# Patient Record
Sex: Female | Born: 1945 | Race: White | Hispanic: No | Marital: Married | Smoking: Current every day smoker
Health system: Southern US, Community
[De-identification: ages and names within clinical notes are randomized; demographics above are authoritative.]

## PROBLEM LIST (undated history)

## (undated) DIAGNOSIS — I10 Essential (primary) hypertension: Secondary | ICD-10-CM

## (undated) DIAGNOSIS — E7800 Pure hypercholesterolemia, unspecified: Secondary | ICD-10-CM

## (undated) DIAGNOSIS — E039 Hypothyroidism, unspecified: Secondary | ICD-10-CM

## (undated) DIAGNOSIS — E079 Disorder of thyroid, unspecified: Secondary | ICD-10-CM

## (undated) DIAGNOSIS — F419 Anxiety disorder, unspecified: Secondary | ICD-10-CM

## (undated) DIAGNOSIS — K219 Gastro-esophageal reflux disease without esophagitis: Secondary | ICD-10-CM

## (undated) HISTORY — PX: OTHER SURGICAL HISTORY: SHX169

---

## 2004-09-15 ENCOUNTER — Ambulatory Visit: Payer: Self-pay | Admitting: *Deleted

## 2005-05-15 ENCOUNTER — Ambulatory Visit: Payer: Self-pay | Admitting: Family Medicine

## 2005-08-30 ENCOUNTER — Ambulatory Visit: Payer: Self-pay | Admitting: Family Medicine

## 2006-01-04 ENCOUNTER — Ambulatory Visit: Payer: Self-pay | Admitting: Family Medicine

## 2006-03-29 ENCOUNTER — Ambulatory Visit: Payer: Self-pay | Admitting: Family Medicine

## 2006-03-30 ENCOUNTER — Ambulatory Visit: Payer: Self-pay | Admitting: Family Medicine

## 2006-08-08 ENCOUNTER — Ambulatory Visit: Payer: Self-pay | Admitting: Family Medicine

## 2006-08-15 ENCOUNTER — Ambulatory Visit: Payer: Self-pay | Admitting: Family Medicine

## 2006-10-17 ENCOUNTER — Ambulatory Visit: Payer: Self-pay | Admitting: Family Medicine

## 2009-05-22 ENCOUNTER — Emergency Department (HOSPITAL_COMMUNITY): Admission: EM | Admit: 2009-05-22 | Discharge: 2009-05-22 | Payer: Self-pay | Admitting: Emergency Medicine

## 2009-11-07 ENCOUNTER — Emergency Department (HOSPITAL_COMMUNITY): Admission: EM | Admit: 2009-11-07 | Discharge: 2009-11-07 | Payer: Self-pay | Admitting: Emergency Medicine

## 2009-11-11 ENCOUNTER — Ambulatory Visit (HOSPITAL_COMMUNITY): Admission: RE | Admit: 2009-11-11 | Discharge: 2009-11-11 | Payer: Self-pay | Admitting: Family Medicine

## 2011-07-31 ENCOUNTER — Encounter: Payer: Self-pay | Admitting: Emergency Medicine

## 2011-07-31 ENCOUNTER — Emergency Department (HOSPITAL_COMMUNITY)
Admission: EM | Admit: 2011-07-31 | Discharge: 2011-07-31 | Disposition: A | Discharge status: Home / Self Care | Payer: Medicare Other | Attending: Emergency Medicine | Admitting: Emergency Medicine

## 2011-07-31 ENCOUNTER — Emergency Department (HOSPITAL_COMMUNITY): Payer: Medicare Other

## 2011-07-31 DIAGNOSIS — F17200 Nicotine dependence, unspecified, uncomplicated: Secondary | ICD-10-CM | POA: Insufficient documentation

## 2011-07-31 DIAGNOSIS — I10 Essential (primary) hypertension: Secondary | ICD-10-CM | POA: Insufficient documentation

## 2011-07-31 DIAGNOSIS — R141 Gas pain: Secondary | ICD-10-CM | POA: Insufficient documentation

## 2011-07-31 DIAGNOSIS — R109 Unspecified abdominal pain: Secondary | ICD-10-CM | POA: Insufficient documentation

## 2011-07-31 DIAGNOSIS — K5900 Constipation, unspecified: Secondary | ICD-10-CM | POA: Insufficient documentation

## 2011-07-31 DIAGNOSIS — R1900 Intra-abdominal and pelvic swelling, mass and lump, unspecified site: Secondary | ICD-10-CM | POA: Insufficient documentation

## 2011-07-31 DIAGNOSIS — R142 Eructation: Secondary | ICD-10-CM | POA: Insufficient documentation

## 2011-07-31 DIAGNOSIS — R10817 Generalized abdominal tenderness: Secondary | ICD-10-CM | POA: Insufficient documentation

## 2011-07-31 DIAGNOSIS — R143 Flatulence: Secondary | ICD-10-CM | POA: Insufficient documentation

## 2011-07-31 HISTORY — DX: Essential (primary) hypertension: I10

## 2011-07-31 LAB — URINALYSIS, ROUTINE W REFLEX MICROSCOPIC
Bilirubin Urine: NEGATIVE
Glucose, UA: NEGATIVE mg/dL
Hgb urine dipstick: NEGATIVE
Ketones, ur: NEGATIVE mg/dL
Specific Gravity, Urine: 1.02 (ref 1.005–1.030)
Urobilinogen, UA: 0.2 mg/dL (ref 0.0–1.0)

## 2011-07-31 LAB — URINE MICROSCOPIC-ADD ON

## 2011-07-31 NOTE — ED Notes (Signed)
Pt reports a hx of constipation.  Pt reports not having a "good" bm in the past 2 weeks.  Pt reports having the "feeling" to go, but unable to.  Pt reports swelling and pain to her abdomen since yesterday.  Pt reports drinking metamucil, prune juice, and eating a laxative yesterday w/out relief.

## 2011-07-31 NOTE — ED Notes (Signed)
Assessment unchanged report received

## 2011-07-31 NOTE — ED Provider Notes (Signed)
History   This chart was scribed for Erin Lennert, MD by Clarita Crane. The patient was seen in room APA09/APA09 and the patient's care was started at 4:30PM.   CSN: [id number] Arrival date & time: 07/31/2011  4:01 PM   First MD Initiated Contact with Patient 07/31/11 1624      Chief Complaint  Patient presents with  . Abdominal Pain   HPI Erin Watkins is a 65 y.o. female who presents to the Emergency Department complaining of waxing and waning moderate diffuse abdominal pain onset several days ago and persistent since with associated mild abdominal swelling and constipation. Patient states constipation has not been relieved with use of Miralax, Metamucil and prune juice. Reports she has experienced similar symptoms previously for which she was treated at [facility] ED for. Denies nausea, vomiting, diarrhea, fever, SOB, chest pain. Patient with h/o hypertension and is a current everyday smoker.   Past Medical History  Diagnosis Date  . Hypertension     Past Surgical History  Procedure Date  . Breast biospy     History reviewed. No pertinent family history.  History  Substance Use Topics  . Smoking status: Current Everyday Smoker    Types: Cigarettes  . Smokeless tobacco: Not on file  . Alcohol Use: No    OB History    Grav Para Term Preterm Abortions TAB SAB Ect Mult Living                  Review of Systems  Constitutional: Negative for fatigue.  HENT: Negative for congestion, sinus pressure and ear discharge.   Eyes: Negative for discharge.  Respiratory: Negative for cough.   Cardiovascular: Negative for chest pain.  Gastrointestinal: Positive for abdominal pain, constipation and abdominal distention. Negative for nausea, vomiting and diarrhea.  Genitourinary: Negative for frequency and hematuria.  Musculoskeletal: Negative for back pain.  Skin: Negative for rash.  Neurological: Negative for seizures and headaches.  Hematological: Negative.     Psychiatric/Behavioral: Negative for hallucinations.    Allergies  Review of patient's allergies indicates no known allergies.  Home Medications   Current Outpatient Rx  Name Route Sig Dispense Refill  . ASPIRIN 325 MG PO TABS Oral Take 325 mg by mouth as needed. For pain     . BENAZEPRIL HCL 20 MG PO TABS Oral Take 20 mg by mouth every morning.      Marland Kitchen HYDROCHLOROTHIAZIDE 25 MG PO TABS Oral Take 25 mg by mouth every morning.      Marland Kitchen LEVOTHYROXINE SODIUM 137 MCG PO TABS Oral Take 137 mcg by mouth at bedtime.      . SERTRALINE HCL 100 MG PO TABS Oral Take 100 mg by mouth daily.        BP 132/70  Pulse 76  Temp(Src) 98.2 F (36.8 C) (Oral)  Resp 18  Ht 5\' 5"  (1.651 m)  Wt 145 lb (65.772 kg)  BMI 24.13 kg/m2  SpO2 98%  Physical Exam  Nursing note and vitals reviewed. Constitutional: She is oriented to person, place, and time. She appears well-developed and well-nourished. No distress.  HENT:  Head: Normocephalic and atraumatic.  Eyes: EOM are normal. Pupils are equal, round, and reactive to light.  Neck: Neck supple. No tracheal deviation present.  Cardiovascular: Normal rate and regular rhythm.  Exam reveals no gallop and no friction rub.   No murmur heard. Pulmonary/Chest: Effort normal. No respiratory distress. She has no wheezes.  Abdominal: Soft. She exhibits no distension. There is generalized  tenderness (mild).  Musculoskeletal: Normal range of motion. She exhibits no edema.       DP pulses intact bilaterally.   Neurological: She is alert and oriented to person, place, and time. No sensory deficit.  Skin: Skin is warm and dry.  Psychiatric: She has a normal mood and affect. Her behavior is normal.    ED Course  Procedures (including critical care time)  DIAGNOSTIC STUDIES: Oxygen Saturation is 98% on room air, normal by my interpretation.    COORDINATION OF CARE: 7:33PM- Patient informed of current lab and imaging results which show collection of stool. Patient  informed of intent to d/c home with prescription for a laxative. Patient agrees with current plan.   Labs Reviewed  URINALYSIS, ROUTINE W REFLEX MICROSCOPIC - Abnormal; Notable for the following:    Leukocytes, UA TRACE (*)    All other components within normal limits  URINE MICROSCOPIC-ADD ON - Abnormal; Notable for the following:    Squamous Epithelial / LPF MANY (*)    All other components within normal limits   Dg Abd Acute W/chest  07/31/2011  *RADIOLOGY REPORT*  Clinical Data: Abdominal pain and distention.  ACUTE ABDOMEN SERIES (ABDOMEN 2 VIEW & CHEST 1 VIEW)  Comparison: Two views of the chest 11/07/2009.  Findings: Single view of the chest demonstrates clear lungs.  Lungs appear emphysematous.  Heart size is normal.  No pneumothorax or effusion.  Two views of the abdomen show a normal bowel gas pattern.  No free intraperitoneal air is identified.  No unexpected abdominal calcification.  IMPRESSION: No acute finding chest or abdomen.  Original Report Authenticated By: Bernadene Bell. D'ALESSIO, M.D.     1. Constipation       MDM      The chart was scribed for me under my direct supervision.  I personally performed the history, physical, and medical decision making and all procedures in the evaluation of this patient.Erin Lennert, MD 07/31/11 [phone]

## 2011-07-31 NOTE — ED Notes (Signed)
Pt c/o abd pain and constipation. Pt states when she bends over she feels pressure on her rectum.

## 2012-11-18 ENCOUNTER — Encounter (HOSPITAL_COMMUNITY): Payer: Self-pay

## 2012-11-18 ENCOUNTER — Emergency Department (HOSPITAL_COMMUNITY)
Admission: EM | Admit: 2012-11-18 | Discharge: 2012-11-18 | Disposition: A | Discharge status: Home / Self Care | Payer: Medicare Other | Attending: Emergency Medicine | Admitting: Emergency Medicine

## 2012-11-18 DIAGNOSIS — Z7982 Long term (current) use of aspirin: Secondary | ICD-10-CM | POA: Insufficient documentation

## 2012-11-18 DIAGNOSIS — E079 Disorder of thyroid, unspecified: Secondary | ICD-10-CM | POA: Insufficient documentation

## 2012-11-18 DIAGNOSIS — R109 Unspecified abdominal pain: Secondary | ICD-10-CM | POA: Insufficient documentation

## 2012-11-18 DIAGNOSIS — R300 Dysuria: Secondary | ICD-10-CM | POA: Insufficient documentation

## 2012-11-18 DIAGNOSIS — I10 Essential (primary) hypertension: Secondary | ICD-10-CM | POA: Insufficient documentation

## 2012-11-18 DIAGNOSIS — R350 Frequency of micturition: Secondary | ICD-10-CM | POA: Insufficient documentation

## 2012-11-18 DIAGNOSIS — F17200 Nicotine dependence, unspecified, uncomplicated: Secondary | ICD-10-CM | POA: Insufficient documentation

## 2012-11-18 DIAGNOSIS — Z79899 Other long term (current) drug therapy: Secondary | ICD-10-CM | POA: Insufficient documentation

## 2012-11-18 HISTORY — DX: Disorder of thyroid, unspecified: E07.9

## 2012-11-18 LAB — URINE MICROSCOPIC-ADD ON

## 2012-11-18 LAB — URINALYSIS, ROUTINE W REFLEX MICROSCOPIC
Glucose, UA: NEGATIVE mg/dL
Leukocytes, UA: NEGATIVE
Nitrite: NEGATIVE
Specific Gravity, Urine: 1.025 (ref 1.005–1.030)
Urobilinogen, UA: 0.2 mg/dL (ref 0.0–1.0)
pH: 6 (ref 5.0–8.0)

## 2012-11-18 MED ORDER — CEPHALEXIN 500 MG PO CAPS: 500.0000 mg | ORAL_CAPSULE | Freq: Four times a day (QID) | ORAL | Status: DC

## 2012-11-18 NOTE — ED Provider Notes (Signed)
History     CSN: [id number]  Arrival date & time 11/18/12  1251   First MD Initiated Contact with Patient 11/18/12 1305      Chief Complaint  Patient presents with  . Dysuria     Patient is a 67 y.o. female presenting with dysuria. The history is provided by the patient.  Dysuria  This is a new problem. The current episode started more than 1 week ago. The problem occurs every urination. The problem has been gradually worsening. The pain is moderate. There has been no fever. Associated symptoms include frequency. Pertinent negatives include no chills, no nausea, no vomiting, no discharge, no hematuria, no possible pregnancy and no flank pain. She has tried nothing for the symptoms.  pt reports she has suprapubic pain and it will be relieved with urination.  No vag bleeding or discharge.  She denies vaginal irritation.  No fever.  She does report recent migraine but that has improved.  She reports compliance with her HTN meds.    Past Medical History  Diagnosis Date  . Hypertension   . Thyroid disease     Past Surgical History  Procedure Laterality Date  . Breast biospy      History reviewed. No pertinent family history.  History  Substance Use Topics  . Smoking status: Current Every Day Smoker    Types: Cigarettes  . Smokeless tobacco: Not on file  . Alcohol Use: No    OB History   Grav Para Term Preterm Abortions TAB SAB Ect Mult Living                  Review of Systems  Constitutional: Negative for fever, chills and fatigue.  Respiratory: Negative for shortness of breath.   Cardiovascular: Negative for chest pain.  Gastrointestinal: Positive for abdominal pain. Negative for nausea, vomiting and diarrhea.  Genitourinary: Positive for dysuria and frequency. Negative for hematuria, flank pain, vaginal bleeding, vaginal discharge, vaginal pain and pelvic pain.  Musculoskeletal: Negative for back pain.  Neurological: Negative for weakness.  Psychiatric/Behavioral:  Negative for agitation.  All other systems reviewed and are negative.    Allergies  Review of patient's allergies indicates no known allergies.  Home Medications   Current Outpatient Rx  Name  Route  Sig  Dispense  Refill  . aspirin 325 MG tablet   Oral   Take 325 mg by mouth daily. For pain         . benazepril (LOTENSIN) 20 MG tablet   Oral   Take 20 mg by mouth every morning.           Marland Kitchen BIOTIN PO   Oral   Take 3 tablets by mouth daily.         Marland Kitchen levothyroxine (SYNTHROID) 150 MCG tablet   Oral   Take 150 mcg by mouth daily. Patient is taking the name brand medication           BP 211/77  Pulse 78  Temp(Src) 98.2 F (36.8 C) (Oral)  Resp 18  Ht 5' 5.5" (1.664 m)  Wt 155 lb (70.308 kg)  BMI 25.39 kg/m2  SpO2 99%  Physical Exam CONSTITUTIONAL: Well developed/well nourished HEAD: Normocephalic/atraumatic EYES: EOMI/PERRL ENMT: Mucous membranes moist NECK: supple no meningeal signs SPINE:entire spine nontender CV: S1/S2 noted, no murmurs/rubs/gallops noted LUNGS: Lungs are clear to auscultation bilaterally, no apparent distress ABDOMEN: soft, minimal suprapubic tenderness, no rebound or guarding GU:no cva tenderness NEURO: Pt is awake/alert, moves all extremitiesx4, no ataxia  EXTREMITIES: pulses normal, full ROM SKIN: warm, color normal PSYCH: no abnormalities of mood noted  ED Course  Procedures   Labs Reviewed  URINALYSIS, ROUTINE W REFLEX MICROSCOPIC - Abnormal; Notable for the following:    Hgb urine dipstick SMALL (*)    All other components within normal limits  URINE MICROSCOPIC-ADD ON - Abnormal; Notable for the following:    Squamous Epithelial / LPF FEW (*)    All other components within normal limits  URINE CULTURE   Pt well appearing and in no distress.  Her abdominal exam is benign.  She is ambulatory and without any weakness or pain.  She is hypertensive but has no signs of hypertensive emergency - no cp/sob.  No focal weakness.   No significant HA currently.;  She reports most of her pain is just before she urinates and then is relieved with urination.  Even though u/a is not impressive for significant uti, will place on keflex, send urine culture and refer to urology.  She has no signs of acute abdominal process at this time   MDM  Nursing notes including past medical history and social history reviewed and considered in documentation Labs/vital reviewed and considered         Joya Gaskins, MD 11/18/12 1531

## 2012-11-18 NOTE — ED Notes (Signed)
Pt c/o lower abdominal pain along with increase urinary urgency and polyuria. Pt denies burning with urination but states discomfort is relieved after she urinates.

## 2012-11-18 NOTE — ED Notes (Addendum)
Pt reports she thinks she has a possible bladder infection. Denies blood in urine. Reports pain when urinating and also prior to urination. Reports soreness.  Upon taking her blood pressure today, 220/94 in right arm and 211/77 in left arm. Pt reports HA but reports history of migraines. Pt reports taking BP meds and took 20mg  of Benazepril and 2 aspirins today. Pt reports she has had problems with elevated BP within the past few weeks.

## 2012-11-19 LAB — URINE CULTURE: Colony Count: 6000

## 2015-01-31 ENCOUNTER — Encounter (HOSPITAL_COMMUNITY): Payer: Self-pay | Admitting: Emergency Medicine

## 2015-01-31 ENCOUNTER — Emergency Department (HOSPITAL_COMMUNITY)
Admission: EM | Admit: 2015-01-31 | Discharge: 2015-01-31 | Disposition: A | Discharge status: Home / Self Care | Payer: Medicare Other | Attending: Emergency Medicine | Admitting: Emergency Medicine

## 2015-01-31 ENCOUNTER — Emergency Department (HOSPITAL_COMMUNITY): Payer: Medicare Other

## 2015-01-31 DIAGNOSIS — Z720 Tobacco use: Secondary | ICD-10-CM | POA: Insufficient documentation

## 2015-01-31 DIAGNOSIS — I10 Essential (primary) hypertension: Secondary | ICD-10-CM | POA: Insufficient documentation

## 2015-01-31 DIAGNOSIS — Z7982 Long term (current) use of aspirin: Secondary | ICD-10-CM | POA: Diagnosis not present

## 2015-01-31 DIAGNOSIS — E079 Disorder of thyroid, unspecified: Secondary | ICD-10-CM | POA: Diagnosis not present

## 2015-01-31 DIAGNOSIS — N390 Urinary tract infection, site not specified: Secondary | ICD-10-CM | POA: Diagnosis not present

## 2015-01-31 DIAGNOSIS — R1012 Left upper quadrant pain: Secondary | ICD-10-CM | POA: Diagnosis present

## 2015-01-31 DIAGNOSIS — R109 Unspecified abdominal pain: Secondary | ICD-10-CM

## 2015-01-31 DIAGNOSIS — Z79899 Other long term (current) drug therapy: Secondary | ICD-10-CM | POA: Diagnosis not present

## 2015-01-31 LAB — URINALYSIS, ROUTINE W REFLEX MICROSCOPIC
BILIRUBIN URINE: NEGATIVE
GLUCOSE, UA: NEGATIVE mg/dL
KETONES UR: NEGATIVE mg/dL
NITRITE: NEGATIVE
Protein, ur: NEGATIVE mg/dL
Specific Gravity, Urine: >1.03 — ABNORMAL HIGH (ref 1.005–1.030)
UROBILINOGEN UA: 0.2 mg/dL (ref 0.0–1.0)
pH: 5.5 (ref 5.0–8.0)

## 2015-01-31 LAB — COMPREHENSIVE METABOLIC PANEL
ALK PHOS: 70 U/L (ref 38–126)
ALT: 16 U/L (ref 14–54)
AST: 22 U/L (ref 15–41)
Albumin: 3.8 g/dL (ref 3.5–5.0)
Anion gap: 7 (ref 5–15)
BILIRUBIN TOTAL: 0.3 mg/dL (ref 0.3–1.2)
BUN: 17 mg/dL (ref 6–20)
CO2: 28 mmol/L (ref 22–32)
CREATININE: 0.73 mg/dL (ref 0.44–1.00)
Calcium: 9.3 mg/dL (ref 8.9–10.3)
Chloride: 105 mmol/L (ref 101–111)
GFR calc non Af Amer: >60 mL/min (ref >60)
GLUCOSE: 102 mg/dL — AB (ref 65–99)
POTASSIUM: 3.8 mmol/L (ref 3.5–5.1)
Sodium: 140 mmol/L (ref 135–145)
TOTAL PROTEIN: 6.8 g/dL (ref 6.5–8.1)

## 2015-01-31 LAB — CBC WITH DIFFERENTIAL/PLATELET
BASOS ABS: 0 10*3/uL (ref 0.0–0.1)
Basophils Relative: 0 % (ref 0–1)
Eosinophils Absolute: 0.4 10*3/uL (ref 0.0–0.7)
Eosinophils Relative: 5 % (ref 0–5)
HEMATOCRIT: 41.1 % (ref 36.0–46.0)
Hemoglobin: 13.8 g/dL (ref 12.0–15.0)
LYMPHS PCT: 37 % (ref 12–46)
Lymphs Abs: 2.9 10*3/uL (ref 0.7–4.0)
MCH: 28.4 pg (ref 26.0–34.0)
MCHC: 33.6 g/dL (ref 30.0–36.0)
MCV: 84.6 fL (ref 78.0–100.0)
MONO ABS: 0.4 10*3/uL (ref 0.1–1.0)
MONOS PCT: 5 % (ref 3–12)
NEUTROS ABS: 4.2 10*3/uL (ref 1.7–7.7)
Neutrophils Relative %: 53 % (ref 43–77)
PLATELETS: 241 10*3/uL (ref 150–400)
RBC: 4.86 MIL/uL (ref 3.87–5.11)
RDW: 13.8 % (ref 11.5–15.5)
WBC: 7.9 10*3/uL (ref 4.0–10.5)

## 2015-01-31 LAB — URINE MICROSCOPIC-ADD ON

## 2015-01-31 MED ORDER — HYDROMORPHONE HCL 1 MG/ML IJ SOLN
1.0000 mg | Freq: Once | INTRAMUSCULAR | Status: AC
  Administered 2015-01-31: 1 mg via INTRAMUSCULAR
  Filled 2015-01-31: qty 1

## 2015-01-31 MED ORDER — IOHEXOL 300 MG/ML  SOLN
100.0000 mL | Freq: Once | INTRAMUSCULAR | Status: AC | PRN
  Administered 2015-01-31: 100 mL via INTRAVENOUS

## 2015-01-31 MED ORDER — OXYCODONE-ACETAMINOPHEN 5-325 MG PO TABS: 1.0000 | ORAL_TABLET | ORAL | Status: DC | PRN

## 2015-01-31 MED ORDER — SODIUM CHLORIDE 0.9 % IV BOLUS (SEPSIS)
1000.0000 mL | Freq: Once | INTRAVENOUS | Status: AC
  Administered 2015-01-31: 1000 mL via INTRAVENOUS

## 2015-01-31 MED ORDER — IOHEXOL 300 MG/ML  SOLN
50.0000 mL | Freq: Once | INTRAMUSCULAR | Status: AC | PRN
Start: 2015-01-31 — End: 2015-01-31
  Administered 2015-01-31: 50 mL via ORAL

## 2015-01-31 MED ORDER — DEXTROSE 5 % IV SOLN
1.0000 g | Freq: Once | INTRAVENOUS | Status: AC
  Administered 2015-01-31: 1 g via INTRAVENOUS
  Filled 2015-01-31: qty 10

## 2015-01-31 MED ORDER — DEXTROSE 5 % IV SOLN
1.0000 g | Freq: Once | INTRAVENOUS | Status: AC
  Administered 2015-01-31: 1 g via INTRAVENOUS

## 2015-01-31 MED ORDER — CIPROFLOXACIN HCL 500 MG PO TABS: 500.0000 mg | ORAL_TABLET | Freq: Two times a day (BID) | ORAL | Status: DC

## 2015-01-31 MED ORDER — PROMETHAZINE HCL 25 MG PO TABS: 25.0000 mg | ORAL_TABLET | Freq: Four times a day (QID) | ORAL | Status: DC | PRN

## 2015-01-31 MED ORDER — MORPHINE SULFATE 4 MG/ML IJ SOLN
4.0000 mg | Freq: Once | INTRAMUSCULAR | Status: AC
  Administered 2015-01-31: 4 mg via INTRAVENOUS
  Filled 2015-01-31: qty 1

## 2015-01-31 MED ORDER — ONDANSETRON HCL 4 MG/2ML IJ SOLN
4.0000 mg | Freq: Once | INTRAMUSCULAR | Status: AC
  Administered 2015-01-31: 4 mg via INTRAVENOUS
  Filled 2015-01-31: qty 2

## 2015-01-31 NOTE — Discharge Instructions (Signed)
We'll treat you for a continued urinary tract infection. Prescription for antibiotic, pain medicine, nausea medicine. Follow-up your primary care doctor. Increase fluids.  CBC and chemistry panel were normal. Urinalysis shows 7-10 white cells and a few bacteria. CT scan of abdomen and pelvis revealed no obvious pathology for symptoms.  She had diverticulosis but not diverticulitis. Also noted were some calcifications in the aorta

## 2015-01-31 NOTE — ED Provider Notes (Signed)
CSN: [id number]     Arrival date & time 01/31/15  1739 History   First MD Initiated Contact with Patient 01/31/15 1813     Chief Complaint  Patient presents with  . Flank Pain     (Consider location/radiation/quality/duration/timing/severity/associated sxs/prior Treatment) HPI .Marland Kitchen.... Left flank pain radiating to the left upper quadrant for one month. Patient is eating normally. She has tried aspirin and hydrocodone with minimal relief. No dysuria, fever, chills, vomiting, diarrhea, hematuria. No trauma. No history of distal bowel pathology. Severity is moderate. Past Medical History  Diagnosis Date  . Hypertension   . Thyroid disease    Past Surgical History  Procedure Laterality Date  . Breast biospy     History reviewed. No pertinent family history. History  Substance Use Topics  . Smoking status: Current Every Day Smoker -- 0.50 packs/day    Types: Cigarettes  . Smokeless tobacco: Not on file  . Alcohol Use: No   OB History    No data available     Review of Systems  All other systems reviewed and are negative.     Allergies  Review of patient's allergies indicates no known allergies.  Home Medications   Prior to Admission medications   Medication Sig Start Date End Date Taking? Authorizing Provider  aspirin 325 MG tablet Take 650 mg by mouth every 6 (six) hours as needed for mild pain or moderate pain. For pain   Yes Historical Provider, MD  benazepril (LOTENSIN) 20 MG tablet Take 20 mg by mouth every morning.     Yes Historical Provider, MD  levothyroxine (SYNTHROID, LEVOTHROID) 100 MCG tablet Take 100 mcg by mouth daily. 01/26/15 01/26/16 Yes Historical Provider, MD  cephALEXin (KEFLEX) 500 MG capsule Take 1 capsule (500 mg total) by mouth 4 (four) times daily. Patient not taking: Reported on 01/31/2015 11/18/12   Zadie Rhineonald Wickline, MD  ciprofloxacin (CIPRO) 500 MG tablet Take 1 tablet (500 mg total) by mouth 2 (two) times daily. One po bid x 7 days 01/31/15   Donnetta HutchingBrian  Nasira Janusz, MD  oxyCODONE-acetaminophen (PERCOCET) 5-325 MG per tablet Take 1-2 tablets by mouth every 4 (four) hours as needed. 01/31/15   Donnetta HutchingBrian Deyonte Cadden, MD  promethazine (PHENERGAN) 25 MG tablet Take 1 tablet (25 mg total) by mouth every 6 (six) hours as needed. 01/31/15   Donnetta HutchingBrian Indie Boehne, MD   BP 195/80 mmHg  Pulse 65  Temp(Src) 98.1 F (36.7 C) (Oral)  Resp 18  Ht 5\' 5"  (1.651 m)  Wt 155 lb (70.308 kg)  BMI 25.79 kg/m2  SpO2 97% Physical Exam  Constitutional: She is oriented to person, place, and time. She appears well-developed and well-nourished.  HENT:  Head: Normocephalic and atraumatic.  Eyes: Conjunctivae and EOM are normal. Pupils are equal, round, and reactive to light.  Neck: Normal range of motion. Neck supple.  Cardiovascular: Normal rate and regular rhythm.   Pulmonary/Chest: Effort normal and breath sounds normal.  Abdominal: Soft. Bowel sounds are normal.  Minimal left upper quadrant tenderness  Genitourinary:  Minimal left flank tenderness  Musculoskeletal: Normal range of motion.  Neurological: She is alert and oriented to person, place, and time.  Skin: Skin is warm and dry.  Psychiatric: She has a normal mood and affect. Her behavior is normal.  Nursing note and vitals reviewed.   ED Course  Procedures (including critical care time) Labs Review Labs Reviewed  COMPREHENSIVE METABOLIC PANEL - Abnormal; Notable for the following:    Glucose, Bld 102 (*)  All other components within normal limits  URINALYSIS, ROUTINE W REFLEX MICROSCOPIC - Abnormal; Notable for the following:    Specific Gravity, Urine >1.030 (*)    Hgb urine dipstick TRACE (*)    Leukocytes, UA TRACE (*)    All other components within normal limits  URINE MICROSCOPIC-ADD ON - Abnormal; Notable for the following:    Squamous Epithelial / LPF FEW (*)    Bacteria, UA FEW (*)    All other components within normal limits  URINE CULTURE  CBC WITH DIFFERENTIAL/PLATELET    Imaging Review Ct Abdomen  Pelvis W Contrast  01/31/2015   CLINICAL DATA:  Subacute onset of left flank pain for 3 weeks. Initial encounter.  EXAM: CT ABDOMEN AND PELVIS WITH CONTRAST  TECHNIQUE: Multidetector CT imaging of the abdomen and pelvis was performed using the standard protocol following bolus administration of intravenous contrast.  CONTRAST:  100mL OMNIPAQUE IOHEXOL 300 MG/ML  SOLN  COMPARISON:  Abdominal radiograph performed 07/31/2011  FINDINGS: Minimal bibasilar atelectasis is noted.  The liver and spleen are unremarkable in appearance. The gallbladder is within normal limits. The pancreas and adrenal glands are unremarkable.  Bilateral renal pelvicaliectasis remains within normal limits. There is no definite evidence of hydronephrosis. There is no evidence of hydronephrosis. No renal or ureteral stones are seen. No perinephric stranding is appreciated.  No free fluid is identified. The small bowel is unremarkable in appearance. The stomach is within normal limits. No acute vascular abnormalities are seen. Relatively diffuse calcification is seen along the abdominal aorta and its branches.  The appendix is normal in caliber and contains air, without evidence of appendicitis. Scattered diverticulosis is noted along the sigmoid colon, without evidence of diverticulitis.  The bladder is moderately distended and grossly unremarkable. The uterus is unremarkable in appearance. The ovaries are relatively symmetric. No suspicious adnexal masses are seen. No inguinal lymphadenopathy is seen.  No acute osseous abnormalities are identified.  IMPRESSION: 1. No acute abnormality seen within the abdomen or pelvis. 2. Relatively diffuse calcification along the abdominal aorta and its branches. 3. Scattered diverticulosis along the sigmoid colon, without evidence of diverticulitis.   Electronically Signed   By: Roanna RaiderJeffery  Chang M.D.   On: 01/31/2015 20:36     EKG Interpretation None      MDM   Final diagnoses:  Left flank pain  UTI  (lower urinary tract infection)    No acute abdomen. Vital signs are normal. CT scan shows diverticulosis but no diverticulitis. There is also some calcifications along the abdominal aorta. This would not explain her symptoms. She has evidence of a minor urinary infection. Discharge medications include Cipro 500 mg, Percocet, Phenergan 25 mg. She has primary care follow-up    Donnetta HutchingBrian Milferd Ansell, MD 01/31/15 2200

## 2015-01-31 NOTE — ED Notes (Signed)
PT c/o left flank pain x3 weeks. PT denies any injury recently and denies any urinary symptoms. PT denies any n/v/d.

## 2015-02-02 LAB — URINE CULTURE
CULTURE: NO GROWTH
Colony Count: NO GROWTH
SPECIAL REQUESTS: NORMAL

## 2015-11-22 DIAGNOSIS — E785 Hyperlipidemia, unspecified: Secondary | ICD-10-CM | POA: Insufficient documentation

## 2016-01-24 DIAGNOSIS — E039 Hypothyroidism, unspecified: Secondary | ICD-10-CM | POA: Insufficient documentation

## 2016-01-24 DIAGNOSIS — I10 Essential (primary) hypertension: Secondary | ICD-10-CM | POA: Insufficient documentation

## 2017-01-18 DIAGNOSIS — J301 Allergic rhinitis due to pollen: Secondary | ICD-10-CM | POA: Insufficient documentation

## 2017-06-27 ENCOUNTER — Encounter (INDEPENDENT_AMBULATORY_CARE_PROVIDER_SITE_OTHER): Payer: Self-pay | Admitting: Internal Medicine

## 2017-06-27 ENCOUNTER — Encounter (INDEPENDENT_AMBULATORY_CARE_PROVIDER_SITE_OTHER): Payer: Self-pay

## 2017-07-24 ENCOUNTER — Encounter (INDEPENDENT_AMBULATORY_CARE_PROVIDER_SITE_OTHER): Payer: Self-pay | Admitting: *Deleted

## 2017-07-24 ENCOUNTER — Ambulatory Visit (INDEPENDENT_AMBULATORY_CARE_PROVIDER_SITE_OTHER): Payer: Medicare Other | Admitting: Internal Medicine

## 2017-07-24 ENCOUNTER — Telehealth (INDEPENDENT_AMBULATORY_CARE_PROVIDER_SITE_OTHER): Payer: Self-pay | Admitting: *Deleted

## 2017-07-24 ENCOUNTER — Encounter (INDEPENDENT_AMBULATORY_CARE_PROVIDER_SITE_OTHER): Payer: Self-pay | Admitting: Internal Medicine

## 2017-07-24 VITALS — BP 160/100 | HR 68 | Temp 98.3°F | Ht 66.0 in | Wt 149.6 lb

## 2017-07-24 DIAGNOSIS — K581 Irritable bowel syndrome with constipation: Secondary | ICD-10-CM

## 2017-07-24 DIAGNOSIS — Z1211 Encounter for screening for malignant neoplasm of colon: Secondary | ICD-10-CM | POA: Insufficient documentation

## 2017-07-24 DIAGNOSIS — K580 Irritable bowel syndrome with diarrhea: Secondary | ICD-10-CM

## 2017-07-24 NOTE — Patient Instructions (Addendum)
The risks of bleeding, perforation and infection were reviewed with patient. Fiber 4 gm po.

## 2017-07-24 NOTE — Progress Notes (Signed)
   Subjective:    Patient ID: Erin Watkins, female    DOB: 05/06/1946, 71 y.o.   MRN: [id number]  HPI Referred by Dr. Lysbeth GalasNyland for chronic diarrhea. She has had diarrhea x 5 months. Saw Dr. Joyce CopaNyland's PA about 5 months ago and was told to take anti-diarrhea which did help. Today she states she is constipated.  She says her stools are small. The constipation started about 7 days ago.  There has been no blood. She is having about 2-3 stools a day. Stools are not hard. She is not having to strain. She eats one meal a day.  She has lost about 15 pounds which was intentional.   She further states she has had diarrhea for about a 1 1/2 yrs.  Usually has a BM immediately after eating.  [place].    Her last colonoscopy 10-12 yrs ago. She thinks Dr. Katrinka BlazingSmith did her last colonoscopy.    Review of Systems Past Medical History:  Diagnosis Date  . Hypertension   . Thyroid disease     Past Surgical History:  Procedure Laterality Date  . breast biospy      No Known Allergies  Current Outpatient Medications on File Prior to Visit  Medication Sig Dispense Refill  . aspirin 325 MG tablet Take 650 mg by mouth every 6 (six) hours as needed for mild pain or moderate pain. For pain    . atorvastatin (LIPITOR) 20 MG tablet Take 20 mg daily by mouth.    . benazepril (LOTENSIN) 20 MG tablet Take 20 mg by mouth every morning.      . diphenhydrAMINE (BENADRYL) 25 mg capsule Take 25 mg at bedtime as needed by mouth.    . levothyroxine (SYNTHROID, LEVOTHROID) 100 MCG tablet Take 100 mcg daily before breakfast by mouth.    . Melatonin-Pyridoxine (MELATIN PO) Take by mouth.     No current facility-administered medications on file prior to visit.         Objective:   Physical Exam Blood pressure (!) 200/100, pulse 68, temperature 98.3 F (36.8 C), height 5\' 6"  (1.676 m), weight 149 lb 9.6 oz (67.9 kg).  Alert and oriented.  Skin warm and dry. Oral mucosa is moist.   . Sclera anicteric, conjunctivae is pink. Thyroid not enlarged. No cervical lymphadenopathy. Lungs clear. Heart regular rate and rhythm.  Abdomen is soft. Bowel sounds are positive. No hepatomegaly. No abdominal masses felt. No tenderness.  No edema to lower extremities.          Assessment & Plan:  Diarrhea alternating with constipation.  Needs to take Fiber 4 gm po daily.  Screening colonoscopy. The risks of bleeding, perforation and infection were reviewed with patient.

## 2017-07-24 NOTE — Telephone Encounter (Signed)
Patient needs trilyte 

## 2017-07-25 MED ORDER — PEG 3350-KCL-NA BICARB-NACL 420 G PO SOLR: 4000.0000 mL | Freq: Once | ORAL | 0 refills | Status: AC

## 2017-08-30 ENCOUNTER — Encounter (INDEPENDENT_AMBULATORY_CARE_PROVIDER_SITE_OTHER): Payer: Self-pay | Admitting: *Deleted

## 2017-10-12 ENCOUNTER — Ambulatory Visit (HOSPITAL_COMMUNITY)
Admission: RE | Admit: 2017-10-12 | Discharge: 2017-10-12 | Disposition: A | Discharge status: Home / Self Care | Payer: Medicare Other | Source: Ambulatory Visit | Attending: Internal Medicine | Admitting: Internal Medicine

## 2017-10-12 ENCOUNTER — Other Ambulatory Visit: Payer: Self-pay

## 2017-10-12 ENCOUNTER — Encounter (HOSPITAL_COMMUNITY): Payer: Self-pay | Admitting: *Deleted

## 2017-10-12 ENCOUNTER — Encounter (HOSPITAL_COMMUNITY)
Admission: RE | Disposition: A | Discharge status: Home / Self Care | Payer: Self-pay | Source: Ambulatory Visit | Attending: Internal Medicine

## 2017-10-12 DIAGNOSIS — Z7982 Long term (current) use of aspirin: Secondary | ICD-10-CM | POA: Insufficient documentation

## 2017-10-12 DIAGNOSIS — Z79899 Other long term (current) drug therapy: Secondary | ICD-10-CM | POA: Insufficient documentation

## 2017-10-12 DIAGNOSIS — K581 Irritable bowel syndrome with constipation: Secondary | ICD-10-CM | POA: Insufficient documentation

## 2017-10-12 DIAGNOSIS — Z1211 Encounter for screening for malignant neoplasm of colon: Secondary | ICD-10-CM | POA: Diagnosis not present

## 2017-10-12 DIAGNOSIS — K219 Gastro-esophageal reflux disease without esophagitis: Secondary | ICD-10-CM | POA: Diagnosis not present

## 2017-10-12 DIAGNOSIS — I10 Essential (primary) hypertension: Secondary | ICD-10-CM | POA: Diagnosis not present

## 2017-10-12 DIAGNOSIS — F17210 Nicotine dependence, cigarettes, uncomplicated: Secondary | ICD-10-CM | POA: Insufficient documentation

## 2017-10-12 DIAGNOSIS — K580 Irritable bowel syndrome with diarrhea: Secondary | ICD-10-CM | POA: Insufficient documentation

## 2017-10-12 DIAGNOSIS — E039 Hypothyroidism, unspecified: Secondary | ICD-10-CM | POA: Insufficient documentation

## 2017-10-12 DIAGNOSIS — F419 Anxiety disorder, unspecified: Secondary | ICD-10-CM | POA: Diagnosis not present

## 2017-10-12 DIAGNOSIS — E7800 Pure hypercholesterolemia, unspecified: Secondary | ICD-10-CM | POA: Diagnosis not present

## 2017-10-12 DIAGNOSIS — K5730 Diverticulosis of large intestine without perforation or abscess without bleeding: Secondary | ICD-10-CM

## 2017-10-12 HISTORY — DX: Gastro-esophageal reflux disease without esophagitis: K21.9

## 2017-10-12 HISTORY — PX: COLONOSCOPY: SHX5424

## 2017-10-12 HISTORY — DX: Anxiety disorder, unspecified: F41.9

## 2017-10-12 HISTORY — DX: Hypothyroidism, unspecified: E03.9

## 2017-10-12 HISTORY — DX: Pure hypercholesterolemia, unspecified: E78.00

## 2017-10-12 SURGERY — COLONOSCOPY
Anesthesia: Moderate Sedation

## 2017-10-12 MED ORDER — MIDAZOLAM HCL 5 MG/5ML IJ SOLN
INTRAMUSCULAR | Status: DC | PRN
  Administered 2017-10-12: 1 mg via INTRAVENOUS
  Administered 2017-10-12 (×4): 2 mg via INTRAVENOUS

## 2017-10-12 MED ORDER — STERILE WATER FOR IRRIGATION IR SOLN
Status: DC | PRN
  Administered 2017-10-12: 100 mL

## 2017-10-12 MED ORDER — MIDAZOLAM HCL 5 MG/5ML IJ SOLN
INTRAMUSCULAR | Status: AC
  Filled 2017-10-12: qty 10

## 2017-10-12 MED ORDER — MEPERIDINE HCL 50 MG/ML IJ SOLN
INTRAMUSCULAR | Status: DC | PRN
  Administered 2017-10-12 (×2): 25 mg via INTRAVENOUS

## 2017-10-12 MED ORDER — MEPERIDINE HCL 50 MG/ML IJ SOLN
INTRAMUSCULAR | Status: AC
  Filled 2017-10-12: qty 1

## 2017-10-12 MED ORDER — SODIUM CHLORIDE 0.9 % IV SOLN
INTRAVENOUS | Status: DC
  Administered 2017-10-12: 08:00:00 via INTRAVENOUS

## 2017-10-12 MED ORDER — DICYCLOMINE HCL 10 MG PO CAPS: 10.0000 mg | ORAL_CAPSULE | Freq: Two times a day (BID) | ORAL | 5 refills | Status: DC

## 2017-10-12 NOTE — H&P (Addendum)
Erin Watkins is an 72 y.o. female.   Chief Complaint: Patient is here for colonoscopy. HPI: Patient is 72 year old Caucasian female who is here for colonoscopy.  Last exam was normal about 12 years ago.  History of intermittent diarrhea.  In between she has been constipated.  She is felt to have IBS.  She denies rectal bleeding abdominal pain nocturnal diarrhea or weight loss. Family history is negative for CRC or IBD.  Past Medical History:  Diagnosis Date  . Anxiety   . GERD (gastroesophageal reflux disease)   . Hypercholesteremia   . Hypertension   . Hypothyroidism   . Thyroid disease     Past Surgical History:  Procedure Laterality Date  . breast biospy      Family History  Problem Relation Age of Onset  . Colon cancer Neg Hx    Social History:  reports that she has been smoking cigarettes.  She has a 15.00 pack-year smoking history. she has never used smokeless tobacco. She reports that she does not drink alcohol or use drugs.  Allergies: No Known Allergies  Medications Prior to Admission  Medication Sig Dispense Refill  . aspirin 325 MG tablet Take 325-650 mg by mouth 2 (two) times daily as needed for moderate pain (takes scheduled at least once daily). For pain    . atorvastatin (LIPITOR) 20 MG tablet Take 20 mg by mouth at bedtime.     . benazepril (LOTENSIN) 40 MG tablet Take 20 mg by mouth daily.  3  . diphenhydrAMINE (BENADRYL) 25 mg capsule Take 25 mg by mouth at bedtime as needed (for sleep.).     Marland Kitchen. levothyroxine (SYNTHROID, LEVOTHROID) 100 MCG tablet Take 100 mcg daily before breakfast by mouth.    . Melatonin 3 MG TABS Take 3 mg by mouth at bedtime as needed (for sleep.).    Marland Kitchen. Aspirin-Acetaminophen-Caffeine (GOODYS EXTRA STRENGTH) 500-325-65 MG PACK Take 1 packet by mouth every 8 (eight) hours as needed (for pain.).      No results found for this or any previous visit (from the past 48 hour(s)). No results found.  ROS  Blood pressure 133/85, pulse 79,  temperature 97.6 F (36.4 C), temperature source Oral, resp. rate 12, height 5\' 6"  (1.676 m), weight 149 lb (67.6 kg), SpO2 97 %. Physical Exam  Constitutional: She appears well-developed and well-nourished.  HENT:  Mouth/Throat: Oropharynx is clear and moist.  Eyes: Conjunctivae are normal. No scleral icterus.  Neck: No thyromegaly present.  Cardiovascular: Normal rate, regular rhythm and normal heart sounds.  No murmur heard. Respiratory: Effort normal and breath sounds normal.  GI: Soft. She exhibits no distension and no mass. There is no tenderness.  Musculoskeletal: She exhibits no edema.  Lymphadenopathy:    She has no cervical adenopathy.  Neurological: She is alert.  Skin: Skin is warm and dry.     Assessment/Plan Average risk screening colonoscopy.  Lionel DecemberNajeeb Rehman, MD 10/12/2017, 9:02 AM

## 2017-10-12 NOTE — Discharge Instructions (Signed)
Resume medications including aspirin at usual dose. Dicyclomine 10 mg by mouth before breakfast and evening meal.  Take it every day for 1-2 weeks and thereafter on as-needed basis. No driving for 24 hours. Virtual colonoscopy to be scheduled in 1 month.  Office will call.        Colonoscopy, Adult, Care After This sheet gives you information about how to care for yourself after your procedure. Your doctor may also give you more specific instructions. If you have problems or questions, call your doctor. Follow these instructions at home: General instructions   For the first 24 hours after the procedure: ? Do not drive or use machinery. ? Do not sign important documents. ? Do not drink alcohol. ? Do your daily activities more slowly than normal. ? Eat foods that are soft and easy to digest. ? Rest often.  Take over-the-counter or prescription medicines only as told by your doctor.  It is up to you to get the results of your procedure. Ask your doctor, or the department performing the procedure, when your results will be ready. To help cramping and bloating:  Try walking around.  Put heat on your belly (abdomen) as told by your doctor. Use a heat source that your doctor recommends, such as a moist heat pack or a heating pad. ? Put a towel between your skin and the heat source. ? Leave the heat on for 20-30 minutes. ? Remove the heat if your skin turns bright red. This is especially important if you cannot feel pain, heat, or cold. You can get burned. Eating and drinking  Drink enough fluid to keep your pee (urine) clear or pale yellow.  Return to your normal diet as told by your doctor. Avoid heavy or fried foods that are hard to digest.  Avoid drinking alcohol for as long as told by your doctor. Contact a doctor if:  You have blood in your poop (stool) 2-3 days after the procedure. Get help right away if:  You have more than a small amount of blood in your poop.  You  see large clumps of tissue (blood clots) in your poop.  Your belly is swollen.  You feel sick to your stomach (nauseous).  You throw up (vomit).  You have a fever.  You have belly pain that gets worse, and medicine does not help your pain. This information is not intended to replace advice given to you by your health care provider. Make sure you discuss any questions you have with your health care provider. Document Released: 10/07/2010 Document Revised: 05/29/2016 Document Reviewed: 05/29/2016 Elsevier Interactive Patient Education  2017 [employer]     Diverticulosis Diverticulosis is a condition that develops when small pouches (diverticula) form in the wall of the large intestine (colon). The colon is where water is absorbed and stool is formed. The pouches form when the inside layer of the colon pushes through weak spots in the outer layers of the colon. You may have a few pouches or many of them. What are the causes? The cause of this condition is not known. What increases the risk? The following factors may make you more likely to develop this condition:  Being older than age 77. Your risk for this condition increases with age. Diverticulosis is rare among people younger than age 47. By age 51, many people have it.  Eating a low-fiber diet.  Having frequent constipation.  Being overweight.  Not getting enough exercise.  Smoking.  Taking over-the-counter pain medicines,  like aspirin and ibuprofen.  Having a family history of diverticulosis.  What are the signs or symptoms? In most people, there are no symptoms of this condition. If you do have symptoms, they may include:  Bloating.  Cramps in the abdomen.  Constipation or diarrhea.  Pain in the lower left side of the abdomen.  How is this diagnosed? This condition is most often diagnosed during an exam for other colon problems. Because diverticulosis usually has no symptoms, it often cannot be diagnosed  independently. This condition may be diagnosed by:  Using a flexible scope to examine the colon (colonoscopy).  Taking an X-ray of the colon after dye has been put into the colon (barium enema).  Doing a CT scan.  How is this treated? You may not need treatment for this condition if you have never developed an infection related to diverticulosis. If you have had an infection before, treatment may include:  Eating a high-fiber diet. This may include eating more fruits, vegetables, and grains.  Taking a fiber supplement.  Taking a live bacteria supplement (probiotic).  Taking medicine to relax your colon.  Taking antibiotic medicines.  Follow these instructions at home:  Drink 6-8 glasses of water or more each day to prevent constipation.  Try not to strain when you have a bowel movement.  If you have had an infection before: ? Eat more fiber as directed by your health care provider or your diet and nutrition specialist (dietitian). ? Take a fiber supplement or probiotic, if your health care provider approves.  Take over-the-counter and prescription medicines only as told by your health care provider.  If you were prescribed an antibiotic, take it as told by your health care provider. Do not stop taking the antibiotic even if you start to feel better.  Keep all follow-up visits as told by your health care provider. This is important. Contact a health care provider if:  You have pain in your abdomen.  You have bloating.  You have cramps.  You have not had a bowel movement in 3 days. Get help right away if:  Your pain gets worse.  Your bloating becomes very bad.  You have a fever or chills, and your symptoms suddenly get worse.  You vomit.  You have bowel movements that are bloody or black.  You have bleeding from your rectum. Summary  Diverticulosis is a condition that develops when small pouches (diverticula) form in the wall of the large intestine  (colon).  You may have a few pouches or many of them.  This condition is most often diagnosed during an exam for other colon problems.  If you have had an infection related to diverticulosis, treatment may include increasing the fiber in your diet, taking supplements, or taking medicines. This information is not intended to replace advice given to you by your health care provider. Make sure you discuss any questions you have with your health care provider. Document Released: 06/01/2004 Document Revised: 07/24/2016 Document Reviewed: 07/24/2016 Elsevier Interactive Patient Education  2017 [employer]    Irritable Bowel Syndrome, Adult Irritable bowel syndrome (IBS) is not one specific disease. It is a group of symptoms that affects the organs responsible for digestion (gastrointestinal or GI tract). To regulate how your GI tract works, your body sends signals back and forth between your intestines and your brain. If you have IBS, there may be a problem with these signals. As a result, your GI tract does not function normally. Your intestines may become  more sensitive and overreact to certain things. This is especially true when you eat certain foods or when you are under stress. There are four types of IBS. These may be determined based on the consistency of your stool:  IBS with diarrhea.  IBS with constipation.  Mixed IBS.  Unsubtyped IBS.  It is important to know which type of IBS you have. Some treatments are more likely to be helpful for certain types of IBS. What are the causes? The exact cause of IBS is not known. What increases the risk? You may have a higher risk of IBS if:  You are a woman.  You are younger than 72 years old.  You have a family history of IBS.  You have mental health problems.  You have had bacterial infection of your GI tract.  What are the signs or symptoms? Symptoms of IBS vary from person to person. The main symptom is abdominal pain or  discomfort. Additional symptoms usually include one or more of the following:  Diarrhea, constipation, or both.  Abdominal swelling or bloating.  Feeling full or sick after eating a small or regular-size meal.  Frequent gas.  Mucus in the stool.  A feeling of having more stool left after a bowel movement.  Symptoms tend to come and go. They may be associated with stress, psychiatric conditions, or nothing at all. How is this diagnosed? There is no specific test to diagnose IBS. Your health care provider will make a diagnosis based on a physical exam, medical history, and your symptoms. You may have other tests to rule out other conditions that may be causing your symptoms. These may include:  Blood tests.  X-rays.  CT scan.  Endoscopy and colonoscopy. This is a test in which your GI tract is viewed with a long, thin, flexible tube.  How is this treated? There is no cure for IBS, but treatment can help relieve symptoms. IBS treatment often includes:  Changes to your diet, such as: ? Eating more fiber. ? Avoiding foods that cause symptoms. ? Drinking more water. ? Eating regular, medium-sized portioned meals.  Medicines. These may include: ? Fiber supplements if you have constipation. ? Medicine to control diarrhea (antidiarrheal medicines). ? Medicine to help control muscle spasms in your GI tract (antispasmodic medicines). ? Medicines to help with any mental health issues, such as antidepressants or tranquilizers.  Therapy. ? Talk therapy may help with anxiety, depression, or other mental health issues that can make IBS symptoms worse.  Stress reduction. ? Managing your stress can help keep symptoms under control.  Follow these instructions at home:  Take medicines only as directed by your health care provider.  Eat a healthy diet. ? Avoid foods and drinks with added sugar. ? Include more whole grains, fruits, and vegetables gradually into your diet. This may be  especially helpful if you have IBS with constipation. ? Avoid any foods and drinks that make your symptoms worse. These may include dairy products and caffeinated or carbonated drinks. ? Do not eat large meals. ? Drink enough fluid to keep your urine clear or pale yellow.  Exercise regularly. Ask your health care provider for recommendations of good activities for you.  Keep all follow-up visits as directed by your health care provider. This is important. Contact a health care provider if:  You have constant pain.  You have trouble or pain with swallowing.  You have worsening diarrhea. Get help right away if:  You have severe and worsening abdominal  pain.  You have diarrhea and: ? You have a rash, stiff neck, or severe headache. ? You are irritable, sleepy, or difficult to awaken. ? You are weak, dizzy, or extremely thirsty.  You have bright red blood in your stool or you have black tarry stools.  You have unusual abdominal swelling that is painful.  You vomit continuously.  You vomit blood (hematemesis).  You have both abdominal pain and a fever. This information is not intended to replace advice given to you by your health care provider. Make sure you discuss any questions you have with your health care provider. Document Released: 09/04/2005 Document Revised: 02/04/2016 Document Reviewed: 05/22/2014 Elsevier Interactive Patient Education  2018 [employer].

## 2017-10-12 NOTE — Op Note (Signed)
[facility] Patient Name: Erin AlesConnie Watkins Procedure Date: 10/12/2017 8:44 AM MRN: [id number] Date of Birth: 05/11/1946 Attending MD: Lionel DecemberNajeeb Raushanah Osmundson , MD CSN: [id number] Age: [age] Admit Type: Outpatient Procedure:                Colonoscopy Indications:              Screening for colorectal malignant neoplasm Providers:                Lionel DecemberNajeeb Oakley Orban, MD, Edrick Kinsammy Vaught, RN, Edythe ClarityKelly Cox,                            Technician Referring MD:             Joette CatchingLeonard Nyland, MD Medicines:                Meperidine 50 mg IV, Midazolam 9 mg IV Complications:            No immediate complications. Estimated Blood Loss:     Estimated blood loss: none. Procedure:                Pre-Anesthesia Assessment:                           - Prior to the procedure, a History and Physical                            was performed, and patient medications and                            allergies were reviewed. The patient's tolerance of                            previous anesthesia was also reviewed. The risks                            and benefits of the procedure and the sedation                            options and risks were discussed with the patient.                            All questions were answered, and informed consent                            was obtained. Prior Anticoagulants: The patient                            last took aspirin 2 days prior to the procedure.                            ASA Grade Assessment: I - A normal, healthy                            patient. After reviewing the risks and benefits,  the patient was deemed in satisfactory condition to                            undergo the procedure.                           After obtaining informed consent, the colonoscope                            was passed under direct vision. Throughout the                            procedure, the patient's blood pressure, pulse, and                            oxygen  saturations were monitored continuously. The                            [id number]) scope was introduced through                            the anus and advanced to the the ascending colon.                            The colonoscopy was technically difficult and                            complex due to restricted mobility of the colon.                            The patient tolerated the procedure fairly well.                            The quality of the bowel preparation was good. The                            ileocecal valve and the rectum were photographed. Scope In: 9:11:12 AM Scope Out: 9:40:36 AM Total Procedure Duration: 0 hours 29 minutes 24 seconds  Findings:      The perianal and digital rectal examinations were normal.      A few medium-mouthed diverticula were found in the sigmoid colon.      The exam was otherwise normal throughout the examined colon.      The retroflexed view of the distal rectum and anal verge was normal and       showed no anal or rectal abnormalities. Impression:               - Incomplete exam to ascending colon.                           - Diverticulosis in the sigmoid colon.                           - The distal rectum and anal verge are normal on  retroflexion view.                           - No specimens collected. Moderate Sedation:      Moderate (conscious) sedation was administered by the endoscopy nurse       and supervised by the endoscopist. The following parameters were       monitored: oxygen saturation, heart rate, blood pressure, CO2       capnography and response to care. Total physician intraservice time was       34 minutes. Recommendation:           - Patient has a contact number available for                            emergencies. The signs and symptoms of potential                            delayed complications were discussed with the                            patient. Return to normal  activities tomorrow.                            Written discharge instructions were provided to the                            patient.                           - High fiber diet today.                           - Continue present medications.                           - Virtual colonoscopy to be scheduled.                           - Dicyclomine 10 mg po bid.                           - No recommendation at this time regarding repeat                            colonoscopy.                           - Return to GI clinic in 3 months. Procedure Code(s):        --- Professional ---                           [phone], 53, Colonoscopy, flexible; diagnostic,                            including collection of specimen(s) by brushing or  washing, when performed (separate procedure)                           99152, Moderate sedation services provided by the                            same physician or other qualified health care                            professional performing the diagnostic or                            therapeutic service that the sedation supports,                            requiring the presence of an independent trained                            observer to assist in the monitoring of the                            patient's level of consciousness and physiological                            status; initial 15 minutes of intraservice time,                            patient age 58 years or older                           [phone], Moderate sedation services; each additional                            15 minutes intraservice time Diagnosis Code(s):        --- Professional ---                           Z12.11, Encounter for screening for malignant                            neoplasm of colon                           K57.30, Diverticulosis of large intestine without                            perforation or abscess without bleeding CPT copyright 2016  American Medical Association. All rights reserved. The codes documented in this report are preliminary and upon coder review may  be revised to meet current compliance requirements. Lionel December, MD Lionel December, MD 10/12/2017 9:52:17 AM This report has been signed electronically. Number of Addenda: 0

## 2017-10-15 ENCOUNTER — Encounter (INDEPENDENT_AMBULATORY_CARE_PROVIDER_SITE_OTHER): Payer: Self-pay | Admitting: *Deleted

## 2017-10-16 ENCOUNTER — Encounter (HOSPITAL_COMMUNITY): Payer: Self-pay | Admitting: Internal Medicine

## 2017-11-05 ENCOUNTER — Telehealth (INDEPENDENT_AMBULATORY_CARE_PROVIDER_SITE_OTHER): Payer: Self-pay | Admitting: *Deleted

## 2017-11-05 NOTE — Telephone Encounter (Signed)
Patient called stating Dr Karilyn Cotaehman recently done a TCS on her and he told her to keep a journal of her bowels. Patient states her bowels are worse since the colonoscopy, patient states she has uncontrollable diarrhea it is like water. Please advise [phone]

## 2017-11-07 NOTE — Telephone Encounter (Signed)
Talked with Dr.Rehman. He ask that the patient be advised to take the Dicyclomine 20 mg at breakfast and at lunch. She is to take Imodium by mouth 1 at breakfast and 1 at lunch.  She was called and made aware , she is to call us if she has any side effects. Otherwise , she will need to have a OV in March with him.

## 2018-01-14 ENCOUNTER — Ambulatory Visit (INDEPENDENT_AMBULATORY_CARE_PROVIDER_SITE_OTHER): Payer: Medicare Other | Admitting: Internal Medicine

## 2018-04-01 ENCOUNTER — Other Ambulatory Visit (INDEPENDENT_AMBULATORY_CARE_PROVIDER_SITE_OTHER): Payer: Self-pay | Admitting: Internal Medicine

## 2019-03-24 ENCOUNTER — Other Ambulatory Visit: Payer: Self-pay

## 2019-03-25 ENCOUNTER — Ambulatory Visit (INDEPENDENT_AMBULATORY_CARE_PROVIDER_SITE_OTHER): Payer: Medicare Other | Admitting: Family Medicine

## 2019-03-25 ENCOUNTER — Encounter: Payer: Self-pay | Admitting: Family Medicine

## 2019-03-25 VITALS — BP 178/84 | HR 67 | Temp 97.9°F | Ht 66.0 in | Wt 161.6 lb

## 2019-03-25 DIAGNOSIS — E785 Hyperlipidemia, unspecified: Secondary | ICD-10-CM

## 2019-03-25 DIAGNOSIS — K580 Irritable bowel syndrome with diarrhea: Secondary | ICD-10-CM

## 2019-03-25 DIAGNOSIS — Z7689 Persons encountering health services in other specified circumstances: Secondary | ICD-10-CM

## 2019-03-25 DIAGNOSIS — I10 Essential (primary) hypertension: Secondary | ICD-10-CM | POA: Diagnosis not present

## 2019-03-25 DIAGNOSIS — E039 Hypothyroidism, unspecified: Secondary | ICD-10-CM

## 2019-03-25 MED ORDER — DICYCLOMINE HCL 20 MG PO TABS: 20.0000 mg | ORAL_TABLET | Freq: Three times a day (TID) | ORAL | 3 refills | Status: DC | PRN

## 2019-03-25 MED ORDER — LEVOTHYROXINE SODIUM 100 MCG PO TABS: 100.0000 ug | ORAL_TABLET | Freq: Every day | ORAL | 3 refills | Status: DC

## 2019-03-25 MED ORDER — BENAZEPRIL HCL 40 MG PO TABS: 20.0000 mg | ORAL_TABLET | Freq: Every day | ORAL | 3 refills | Status: DC

## 2019-03-25 NOTE — Progress Notes (Signed)
Subjective:  Patient ID: Erin Watkins, female    DOB: 01-24-1946, 73 y.o.   MRN: [id number]  Chief Complaint:  New Patient (Initial Visit) and Establish Care   HPI: Erin Watkins is a 73 y.o. female presenting on 03/25/2019 for New Patient (Initial Visit) and Establish Care   1. Encounter to establish care  Pt was a pt of Dr. Edrick Oh. He has retired. Pt here to establish care. Pt states she was last seen in office 6-8 months ago. Pt states she does not want any health maintenance or preventative care. States she just wants to make sure she has her blood pressure and thyroid medications.    2. Essential hypertension  Complaint with meds - Yes Checking BP at home - No Exercising Regularly - No Watching Salt intake - No Pertinent ROS:  Headache - No Chest pain - No Dyspnea - No Palpitations - No LE edema - No They report good compliance with medications and can restate their regimen by memory. No medication side effects.  BP Readings from Last 3 Encounters:  03/25/19 (!) 178/84  10/12/17 (!) 127/58  07/24/17 (!) 160/100     3. Irritable bowel syndrome with diarrhea  Pt takes probiotic daily and Bentyl as needed. States this controls her symptoms well. No hematochezia or melena. Intermittent abdominal cramps that resolve after bowel movement.    4. Acquired hypothyroidism  Compliant with medication. No fatigue, weight changes, nail changes, skin changes, or heat or cold intolerance. States last TSH normal.    5. Dyslipidemia  Noncompliant with statin due to myalgias. States she does try to take it once per week. She does not diet or exercise. States she eats what she wants.      Relevant past medical, surgical, family, and social history reviewed and updated as indicated.  Allergies and medications reviewed and updated.   Past Medical History:  Diagnosis Date  . Anxiety   . GERD (gastroesophageal reflux disease)   . Hypercholesteremia   . Hypertension   .  Hypothyroidism   . Thyroid disease     Past Surgical History:  Procedure Laterality Date  . breast biospy    . COLONOSCOPY N/A 10/12/2017   Procedure: COLONOSCOPY;  Surgeon: Rogene Houston, MD;  Location: [facility];  Service: Endoscopy;  Laterality: N/A;  10:30    Social History   Socioeconomic History  . Marital status: Married    Spouse name: Not on file  . Number of children: Not on file  . Years of education: Not on file  . Highest education level: Not on file  Occupational History  . Not on file  Social Needs  . Financial resource strain: Not on file  . Food insecurity    Worry: Not on file    Inability: Not on file  . Transportation needs    Medical: Not on file    Non-medical: Not on file  Tobacco Use  . Smoking status: Current Every Day Smoker    Packs/day: 0.50    Years: 30.00    Pack years: 15.00    Types: Cigarettes  . Smokeless tobacco: Never Used  Substance and Sexual Activity  . Alcohol use: No  . Drug use: No  . Sexual activity: Not on file  Lifestyle  . Physical activity    Days per week: Not on file    Minutes per session: Not on file  . Stress: Not on file  Relationships  . Social connections  Talks on phone: Not on file    Gets together: Not on file    Attends religious service: Not on file    Active member of club or organization: Not on file    Attends meetings of clubs or organizations: Not on file    Relationship status: Not on file  . Intimate partner violence    Fear of current or ex partner: Not on file    Emotionally abused: Not on file    Physically abused: Not on file    Forced sexual activity: Not on file  Other Topics Concern  . Not on file  Social History Narrative  . Not on file    Outpatient Encounter Medications as of 03/25/2019  Medication Sig  . aspirin 325 MG tablet Take 325-650 mg by mouth 2 (two) times daily as needed for moderate pain (takes scheduled at least once daily). For pain  . benazepril  (LOTENSIN) 40 MG tablet Take 0.5 tablets (20 mg total) by mouth daily.  Marland Kitchen dicyclomine (BENTYL) 20 MG tablet Take 1 tablet (20 mg total) by mouth 3 (three) times daily as needed for spasms.  . diphenhydrAMINE (BENADRYL) 25 mg capsule Take 25 mg by mouth at bedtime as needed (for sleep.).   Marland Kitchen lactobacillus acidophilus (BACID) TABS tablet Take 2 tablets by mouth 3 (three) times daily.  Marland Kitchen levothyroxine (SYNTHROID) 100 MCG tablet Take 1 tablet (100 mcg total) by mouth daily before breakfast.  . Melatonin 3 MG TABS Take 3 mg by mouth at bedtime as needed (for sleep.).  . [DISCONTINUED] benazepril (LOTENSIN) 40 MG tablet Take 20 mg by mouth daily.  . [DISCONTINUED] dicyclomine (BENTYL) 20 MG tablet Take 1 tab by mouth 30 minutes before meals and at bedtime  . [DISCONTINUED] levothyroxine (SYNTHROID, LEVOTHROID) 100 MCG tablet Take 100 mcg daily before breakfast by mouth.  . [DISCONTINUED] atorvastatin (LIPITOR) 20 MG tablet Take 20 mg by mouth at bedtime.   . [DISCONTINUED] dicyclomine (BENTYL) 10 MG capsule TAKE ONE CAPSULE BY MOUTH TWICE DAILY BEFORE A MEAL   No facility-administered encounter medications on file as of 03/25/2019.     No Known Allergies  Review of Systems  Constitutional: Negative for activity change, appetite change, chills, diaphoresis, fatigue, fever and unexpected weight change.  Eyes: Negative for photophobia and visual disturbance.  Respiratory: Negative for cough, chest tightness and shortness of breath.   Cardiovascular: Negative for chest pain, palpitations and leg swelling.  Gastrointestinal: Positive for abdominal pain and diarrhea. Negative for anal bleeding, blood in stool, constipation, nausea, rectal pain and vomiting.  Endocrine: Negative for cold intolerance, heat intolerance, polydipsia, polyphagia and polyuria.  Genitourinary: Negative for decreased urine volume, difficulty urinating, vaginal bleeding, vaginal discharge and vaginal pain.  Musculoskeletal:  Positive for myalgias. Negative for arthralgias.  Neurological: Negative for dizziness, tremors, seizures, syncope, facial asymmetry, speech difficulty, weakness, light-headedness, numbness and headaches.  Psychiatric/Behavioral: Negative for confusion.  All other systems reviewed and are negative.       Objective:  BP (!) 178/84   Pulse 67   Temp 97.9 F (36.6 C) (Oral)   Ht 5' 6" (1.676 m)   Wt 161 lb 9.6 oz (73.3 kg)   SpO2 97%   BMI 26.08 kg/m    Wt Readings from Last 3 Encounters:  03/25/19 161 lb 9.6 oz (73.3 kg)  10/12/17 149 lb (67.6 kg)  07/24/17 149 lb 9.6 oz (67.9 kg)    Physical Exam Vitals signs and nursing note reviewed.  Constitutional:  General: She is not in acute distress.    Appearance: Normal appearance. She is well-developed and well-groomed. She is not ill-appearing, toxic-appearing or diaphoretic.  HENT:     Head: Normocephalic and atraumatic.     Jaw: There is normal jaw occlusion.     Right Ear: Hearing normal.     Left Ear: Hearing normal.     Nose: Nose normal.     Mouth/Throat:     Lips: Pink.     Mouth: Mucous membranes are moist.     Pharynx: Oropharynx is clear. Uvula midline.  Eyes:     General: Lids are normal.     Extraocular Movements: Extraocular movements intact.     Conjunctiva/sclera: Conjunctivae normal.     Pupils: Pupils are equal, round, and reactive to light.  Neck:     Musculoskeletal: Normal range of motion and neck supple.     Thyroid: No thyroid mass, thyromegaly or thyroid tenderness.     Vascular: No carotid bruit or JVD.     Trachea: Trachea and phonation normal.  Cardiovascular:     Rate and Rhythm: Normal rate and regular rhythm.     Chest Wall: PMI is not displaced.     Pulses: Normal pulses.     Heart sounds: Normal heart sounds. No murmur. No friction rub. No gallop.   Pulmonary:     Effort: Pulmonary effort is normal. No respiratory distress.     Breath sounds: Normal breath sounds. No wheezing.   Abdominal:     General: Bowel sounds are normal. There is no distension or abdominal bruit.     Palpations: Abdomen is soft. There is no hepatomegaly or splenomegaly.     Tenderness: There is no abdominal tenderness. There is no right CVA tenderness or left CVA tenderness.     Hernia: No hernia is present.  Musculoskeletal: Normal range of motion.     Right lower leg: No edema.     Left lower leg: No edema.  Lymphadenopathy:     Cervical: No cervical adenopathy.  Skin:    General: Skin is warm and dry.     Capillary Refill: Capillary refill takes less than 2 seconds.     Coloration: Skin is not cyanotic, jaundiced or pale.     Findings: No rash.  Neurological:     General: No focal deficit present.     Mental Status: She is alert and oriented to person, place, and time.     Cranial Nerves: Cranial nerves are intact.     Sensory: Sensation is intact.     Motor: Motor function is intact.     Coordination: Coordination is intact.     Gait: Gait is intact.     Deep Tendon Reflexes: Reflexes are normal and symmetric.  Psychiatric:        Attention and Perception: Attention and perception normal.        Mood and Affect: Mood and affect normal.        Speech: Speech normal.        Behavior: Behavior normal. Behavior is cooperative.        Thought Content: Thought content normal.        Cognition and Memory: Cognition and memory normal.        Judgment: Judgment normal.     Results for orders placed or performed during the hospital encounter of 01/31/15  Urine culture   Specimen: Urine, Clean Catch  Result Value Ref Range   Specimen Description URINE,  CLEAN CATCH    Special Requests Normal    Colony Count NO GROWTH Performed at [facility]     Culture NO GROWTH Performed at [facility]     Report Status 02/02/2015 FINAL   CBC with Differential  Result Value Ref Range   WBC 7.9 4.0 - 10.5 K/uL   RBC 4.86 3.87 - 5.11 MIL/uL   Hemoglobin 13.8 12.0 - 15.0  g/dL   HCT 41.1 36.0 - 46.0 %   MCV 84.6 78.0 - 100.0 fL   MCH 28.4 26.0 - 34.0 pg   MCHC 33.6 30.0 - 36.0 g/dL   RDW 13.8 11.5 - 15.5 %   Platelets 241 150 - 400 K/uL   Neutrophils Relative % 53 43 - 77 %   Neutro Abs 4.2 1.7 - 7.7 K/uL   Lymphocytes Relative 37 12 - 46 %   Lymphs Abs 2.9 0.7 - 4.0 K/uL   Monocytes Relative 5 3 - 12 %   Monocytes Absolute 0.4 0.1 - 1.0 K/uL   Eosinophils Relative 5 0 - 5 %   Eosinophils Absolute 0.4 0.0 - 0.7 K/uL   Basophils Relative 0 0 - 1 %   Basophils Absolute 0.0 0.0 - 0.1 K/uL  Comprehensive metabolic panel  Result Value Ref Range   Sodium 140 135 - 145 mmol/L   Potassium 3.8 3.5 - 5.1 mmol/L   Chloride 105 101 - 111 mmol/L   CO2 28 22 - 32 mmol/L   Glucose, Bld 102 (H) 65 - 99 mg/dL   BUN 17 6 - 20 mg/dL   Creatinine, Ser 0.73 0.44 - 1.00 mg/dL   Calcium 9.3 8.9 - 10.3 mg/dL   Total Protein 6.8 6.5 - 8.1 g/dL   Albumin 3.8 3.5 - 5.0 g/dL   AST 22 15 - 41 U/L   ALT 16 14 - 54 U/L   Alkaline Phosphatase 70 38 - 126 U/L   Total Bilirubin 0.3 0.3 - 1.2 mg/dL   GFR calc non Af Amer >60 >60 mL/min   GFR calc Af Amer >60 >60 mL/min   Anion gap 7 5 - 15  Urinalysis, Routine w reflex microscopic  Result Value Ref Range   Color, Urine YELLOW YELLOW   APPearance CLEAR CLEAR   Specific Gravity, Urine >1.030 (H) 1.005 - 1.030   pH 5.5 5.0 - 8.0   Glucose, UA NEGATIVE NEGATIVE mg/dL   Hgb urine dipstick TRACE (A) NEGATIVE   Bilirubin Urine NEGATIVE NEGATIVE   Ketones, ur NEGATIVE NEGATIVE mg/dL   Protein, ur NEGATIVE NEGATIVE mg/dL   Urobilinogen, UA 0.2 0.0 - 1.0 mg/dL   Nitrite NEGATIVE NEGATIVE   Leukocytes, UA TRACE (A) NEGATIVE  Urine microscopic-add on  Result Value Ref Range   Squamous Epithelial / LPF FEW (A) RARE   WBC, UA 7-10 <3 WBC/hpf   RBC / HPF 0-2 <3 RBC/hpf   Bacteria, UA FEW (A) RARE       Pertinent labs & imaging results that were available during my care of the patient were reviewed by me and considered in my  medical decision making.  Assessment & Plan:  Malaijah was seen today for new patient (initial visit) and establish care.  Diagnoses and all orders for this visit:  Encounter to establish care Previous Dr. Edrick Oh pt, here to establish care today.   Essential hypertension Blood pressure elevated today. Pt has not taken medications today. She will take when she gets home. DASH diet and exercise encouraged.  Report any persistent high readings. Follow up in 3 months.  -     CMP14+EGFR -     CBC with Differential/Platelet -     benazepril (LOTENSIN) 40 MG tablet; Take 0.5 tablets (20 mg total) by mouth daily.  Irritable bowel syndrome with diarrhea Well controlled with daily probiotic and Bentyl as needed. Declines linzess today. Will consider if symptoms worsen.  -     CBC with Differential/Platelet -     dicyclomine (BENTYL) 20 MG tablet; Take 1 tablet (20 mg total) by mouth 3 (three) times daily as needed for spasms.  Acquired hypothyroidism Labs pending. Will adjust therapy if needed.  -     Thyroid Panel With TSH -     levothyroxine (SYNTHROID) 100 MCG tablet; Take 1 tablet (100 mcg total) by mouth daily before breakfast.  Dyslipidemia Not taking statin due to myalgias. Discussed every other day or three times weekly dosing. States she will try this. Pt can take Red Yeast Rice over the counter. Labs pending. Diet and exercise encouraged.  -     Lipid panel  Pt declines all health maintenance / preventative care measures.   Continue all other maintenance medications.  Follow up plan: Return in about 3 months (around 06/25/2019), or if symptoms worsen or fail to improve.  Educational handout given for survey  The above assessment and management plan was discussed with the patient. The patient verbalized understanding of and has agreed to the management plan. Patient is aware to call the clinic if symptoms persist or worsen. Patient is aware when to return to the clinic for a  follow-up visit. Patient educated on when it is appropriate to go to the emergency department.   Monia Pouch, FNP-C [facility] [phone]

## 2019-03-25 NOTE — Patient Instructions (Signed)
It was a pleasure seeing you today, Erin Watkins.  Information regarding what we discussed is included in this packet.  Please make an appointment to see me in 3 months.    In a few days you may receive a survey in the mail or online from [employer] regarding your visit with [place] today. Please take a moment to fill this out. Your feedback is very important to our office. It can help [place] better understand your needs as well as improve your experience and satisfaction. Thank you for taking your time to complete it. We care about you.  Because of recent events of COVID-19 ("Coronavirus"), please follow CDC recommendations:   1. Wash your hand frequently 2. Avoid touching your face 3. Stay away from people who are sick 4. If you have symptoms such as fever, cough, shortness of breath then call your healthcare provider for further guidance 5. If you are sick, STAY AT HOME, unless otherwise directed by your healthcare provider. 6. Follow directions from state and national officials regarding staying safe    Please feel free to call our office if any questions or concerns arise.  Warm Regards, Monia Pouch, FNP-C [facility] [address] [place], [place] [address] [phone]

## 2019-03-26 LAB — CBC WITH DIFFERENTIAL/PLATELET
Basophils Absolute: 0.1 10*3/uL (ref 0.0–0.2)
Basos: 1 %
EOS (ABSOLUTE): 0.2 10*3/uL (ref 0.0–0.4)
Eos: 3 %
Hematocrit: 42.3 % (ref 34.0–46.6)
Hemoglobin: 13.8 g/dL (ref 11.1–15.9)
Immature Grans (Abs): 0 10*3/uL (ref 0.0–0.1)
Immature Granulocytes: 0 %
Lymphocytes Absolute: 2.5 10*3/uL (ref 0.7–3.1)
Lymphs: 34 %
MCH: 27.1 pg (ref 26.6–33.0)
MCHC: 32.6 g/dL (ref 31.5–35.7)
MCV: 83 fL (ref 79–97)
Monocytes Absolute: 0.5 10*3/uL (ref 0.1–0.9)
Monocytes: 7 %
Neutrophils Absolute: 4.2 10*3/uL (ref 1.4–7.0)
Neutrophils: 55 %
Platelets: 275 10*3/uL (ref 150–450)
RBC: 5.09 x10E6/uL (ref 3.77–5.28)
RDW: 13 % (ref 11.7–15.4)
WBC: 7.6 10*3/uL (ref 3.4–10.8)

## 2019-03-26 LAB — CMP14+EGFR
ALT: 12 IU/L (ref 0–32)
AST: 16 IU/L (ref 0–40)
Albumin/Globulin Ratio: 1.6 (ref 1.2–2.2)
Albumin: 4 g/dL (ref 3.7–4.7)
Alkaline Phosphatase: 74 IU/L (ref 39–117)
BUN/Creatinine Ratio: 17 (ref 12–28)
BUN: 17 mg/dL (ref 8–27)
Bilirubin Total: 0.2 mg/dL (ref 0.0–1.2)
CO2: 23 mmol/L (ref 20–29)
Calcium: 9.4 mg/dL (ref 8.7–10.3)
Chloride: 103 mmol/L (ref 96–106)
Creatinine, Ser: 1 mg/dL (ref 0.57–1.00)
GFR calc Af Amer: 65 mL/min/{1.73_m2} (ref >59)
GFR calc non Af Amer: 56 mL/min/{1.73_m2} — ABNORMAL LOW (ref >59)
Globulin, Total: 2.5 g/dL (ref 1.5–4.5)
Glucose: 107 mg/dL — ABNORMAL HIGH (ref 65–99)
Potassium: 4.5 mmol/L (ref 3.5–5.2)
Sodium: 141 mmol/L (ref 134–144)
Total Protein: 6.5 g/dL (ref 6.0–8.5)

## 2019-03-26 LAB — THYROID PANEL WITH TSH
Free Thyroxine Index: 2.5 (ref 1.2–4.9)
T3 Uptake Ratio: 25 % (ref 24–39)
T4, Total: 9.9 ug/dL (ref 4.5–12.0)
TSH: 0.297 u[IU]/mL — ABNORMAL LOW (ref 0.450–4.500)

## 2019-03-26 LAB — LIPID PANEL
Chol/HDL Ratio: 5.1 ratio — ABNORMAL HIGH (ref 0.0–4.4)
Cholesterol, Total: 202 mg/dL — ABNORMAL HIGH (ref 100–199)
HDL: 40 mg/dL (ref >39)
LDL Calculated: 129 mg/dL — ABNORMAL HIGH (ref 0–99)
Triglycerides: 163 mg/dL — ABNORMAL HIGH (ref 0–149)
VLDL Cholesterol Cal: 33 mg/dL (ref 5–40)

## 2019-03-26 MED ORDER — LEVOTHYROXINE SODIUM 88 MCG PO TABS: 88.0000 ug | ORAL_TABLET | Freq: Every day | ORAL | 0 refills | Status: DC

## 2019-03-26 NOTE — Addendum Note (Signed)
Addended by: Baruch Gouty on: 03/26/2019 08:18 AM   Modules accepted: Orders

## 2019-06-25 ENCOUNTER — Ambulatory Visit: Payer: Medicare Other | Admitting: Family Medicine

## 2019-11-19 ENCOUNTER — Other Ambulatory Visit: Payer: Self-pay | Admitting: Family Medicine

## 2019-11-19 DIAGNOSIS — I10 Essential (primary) hypertension: Secondary | ICD-10-CM

## 2020-01-19 ENCOUNTER — Other Ambulatory Visit: Payer: Self-pay | Admitting: *Deleted

## 2020-01-19 DIAGNOSIS — I10 Essential (primary) hypertension: Secondary | ICD-10-CM

## 2020-01-19 MED ORDER — BENAZEPRIL HCL 40 MG PO TABS: 20.0000 mg | ORAL_TABLET | Freq: Every day | ORAL | 0 refills | Status: AC

## 2020-03-17 ENCOUNTER — Other Ambulatory Visit: Payer: Self-pay | Admitting: Nurse Practitioner

## 2020-03-17 DIAGNOSIS — I10 Essential (primary) hypertension: Secondary | ICD-10-CM

## 2020-03-17 NOTE — Telephone Encounter (Signed)
Called and spoke with pt = she is thinking that she may switch Dr offices and go somewhere different because her husband is unhappy with his care here; due to not getting the pain meds/ control meds that he was once given (Policy for controls was reviewed with pt) She states not to fill the meds and not to schedule appt at this time. She is aware to call back and set up appt if she wishes to do so.

## 2020-03-17 NOTE — Telephone Encounter (Signed)
Last OV 03/2019 by Marko Stai Next OV not scheduled A 30 day supply was given 01/19/20 Refills denied  ntbs for further refills

## 2020-07-13 DIAGNOSIS — Z299 Encounter for prophylactic measures, unspecified: Secondary | ICD-10-CM | POA: Diagnosis not present

## 2020-07-13 DIAGNOSIS — R220 Localized swelling, mass and lump, head: Secondary | ICD-10-CM | POA: Diagnosis not present

## 2020-07-13 DIAGNOSIS — R221 Localized swelling, mass and lump, neck: Secondary | ICD-10-CM | POA: Diagnosis not present

## 2020-07-13 DIAGNOSIS — F17210 Nicotine dependence, cigarettes, uncomplicated: Secondary | ICD-10-CM | POA: Diagnosis not present

## 2020-07-13 DIAGNOSIS — E0590 Thyrotoxicosis, unspecified without thyrotoxic crisis or storm: Secondary | ICD-10-CM | POA: Diagnosis not present

## 2020-07-13 DIAGNOSIS — I10 Essential (primary) hypertension: Secondary | ICD-10-CM | POA: Diagnosis not present

## 2020-07-14 ENCOUNTER — Other Ambulatory Visit (HOSPITAL_COMMUNITY): Payer: Self-pay | Admitting: Neurology

## 2020-07-14 ENCOUNTER — Other Ambulatory Visit (HOSPITAL_COMMUNITY): Payer: Self-pay | Admitting: Internal Medicine

## 2020-07-14 ENCOUNTER — Other Ambulatory Visit: Payer: Self-pay | Admitting: Internal Medicine

## 2020-07-14 ENCOUNTER — Other Ambulatory Visit (HOSPITAL_COMMUNITY): Payer: Self-pay | Admitting: General Practice

## 2020-07-14 DIAGNOSIS — R220 Localized swelling, mass and lump, head: Secondary | ICD-10-CM

## 2020-07-19 ENCOUNTER — Other Ambulatory Visit: Payer: Self-pay

## 2020-07-19 ENCOUNTER — Ambulatory Visit (HOSPITAL_COMMUNITY)
Admission: RE | Admit: 2020-07-19 | Discharge: 2020-07-19 | Disposition: A | Discharge status: Home / Self Care | Payer: Medicare Other | Source: Ambulatory Visit | Attending: Internal Medicine | Admitting: Internal Medicine

## 2020-07-19 DIAGNOSIS — R220 Localized swelling, mass and lump, head: Secondary | ICD-10-CM | POA: Diagnosis not present

## 2020-07-19 DIAGNOSIS — I672 Cerebral atherosclerosis: Secondary | ICD-10-CM | POA: Diagnosis not present

## 2020-07-19 DIAGNOSIS — I6522 Occlusion and stenosis of left carotid artery: Secondary | ICD-10-CM | POA: Diagnosis not present

## 2020-07-19 DIAGNOSIS — I708 Atherosclerosis of other arteries: Secondary | ICD-10-CM | POA: Diagnosis not present

## 2020-07-19 DIAGNOSIS — I6503 Occlusion and stenosis of bilateral vertebral arteries: Secondary | ICD-10-CM | POA: Diagnosis not present

## 2020-07-19 MED ORDER — IOHEXOL 350 MG/ML SOLN
100.0000 mL | Freq: Once | INTRAVENOUS | Status: AC | PRN
  Administered 2020-07-19: 75 mL via INTRAVENOUS

## 2020-07-20 ENCOUNTER — Ambulatory Visit (HOSPITAL_COMMUNITY)
Admission: RE | Admit: 2020-07-20 | Discharge: 2020-07-20 | Disposition: A | Discharge status: Home / Self Care | Payer: Medicare Other | Source: Ambulatory Visit | Attending: Internal Medicine | Admitting: Internal Medicine

## 2020-07-20 DIAGNOSIS — J984 Other disorders of lung: Secondary | ICD-10-CM | POA: Diagnosis not present

## 2020-07-20 DIAGNOSIS — R220 Localized swelling, mass and lump, head: Secondary | ICD-10-CM | POA: Diagnosis not present

## 2020-07-20 DIAGNOSIS — F17210 Nicotine dependence, cigarettes, uncomplicated: Secondary | ICD-10-CM | POA: Diagnosis not present

## 2020-07-20 DIAGNOSIS — K589 Irritable bowel syndrome without diarrhea: Secondary | ICD-10-CM | POA: Diagnosis not present

## 2020-07-20 DIAGNOSIS — I10 Essential (primary) hypertension: Secondary | ICD-10-CM | POA: Diagnosis not present

## 2020-07-20 DIAGNOSIS — E0590 Thyrotoxicosis, unspecified without thyrotoxic crisis or storm: Secondary | ICD-10-CM | POA: Diagnosis not present

## 2020-07-20 DIAGNOSIS — Z2821 Immunization not carried out because of patient refusal: Secondary | ICD-10-CM | POA: Diagnosis not present

## 2020-07-20 DIAGNOSIS — I700 Atherosclerosis of aorta: Secondary | ICD-10-CM | POA: Diagnosis not present

## 2020-07-20 DIAGNOSIS — Z299 Encounter for prophylactic measures, unspecified: Secondary | ICD-10-CM | POA: Diagnosis not present

## 2020-07-20 DIAGNOSIS — R222 Localized swelling, mass and lump, trunk: Secondary | ICD-10-CM | POA: Diagnosis not present

## 2020-07-20 MED ORDER — IOHEXOL 350 MG/ML SOLN
75.0000 mL | Freq: Once | INTRAVENOUS | Status: AC | PRN
  Administered 2020-07-20: 75 mL via INTRAVENOUS

## 2020-08-02 ENCOUNTER — Other Ambulatory Visit: Payer: Self-pay

## 2020-08-02 ENCOUNTER — Encounter: Payer: Self-pay | Admitting: Vascular Surgery

## 2020-08-02 ENCOUNTER — Ambulatory Visit: Payer: Medicare Other | Admitting: Vascular Surgery

## 2020-08-02 VITALS — BP 160/92 | HR 85 | Temp 98.6°F | Resp 18 | Ht 66.0 in | Wt 149.0 lb

## 2020-08-02 DIAGNOSIS — I6522 Occlusion and stenosis of left carotid artery: Secondary | ICD-10-CM

## 2020-08-02 NOTE — Progress Notes (Signed)
[facility] [place]  Patient name: Erin Watkins MRN: [id number] DOB: [birth year] Sex: female  REASON FOR CONSULT: Evaluation asymptomatic left internal carotid artery stenosis  HPI: Erin Watkins is a 74 y.o. 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  She is asymptomatic.  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  She reports that this has been present for many years.  She recently underwent CT scan for further evaluation.  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  She is right-handed.  She denies any prior history of amaurosis fugax, transient ischemic attack or stroke.  She is a long-term smoker.  Past Medical History:  Diagnosis Date   Anxiety    GERD (gastroesophageal reflux disease)    Hypercholesteremia    Hypertension    Hypothyroidism    Thyroid disease     Family History  Problem Relation Age of Onset   Diabetes Grandson    Colon cancer Neg Hx     SOCIAL HISTORY: Social History   Socioeconomic History   Marital status: Married    Spouse name: Not on file   Number of children: Not on file   Years of education: Not on file   Highest education level: Not on file  Occupational History   Not on file  Tobacco Use   Smoking status: Current Every Day Smoker    Packs/day: 0.50    Years: 30.00    Pack years: 15.00    Types: Cigarettes   Smokeless tobacco: Never Used  Building services engineer Use: Never used  Substance and Sexual Activity   Alcohol use: No   Drug use: No   Sexual activity: Not on file  Other Topics Concern   Not on file  Social History Narrative   Not on file    Social Determinants of Health   Financial Resource Strain:    Difficulty of Paying Living Expenses: Not on file  Food Insecurity:    Worried About Programme researcher, broadcasting/film/video in the Last Year: Not on file   [employer] of Food in the Last Year: Not on file  Transportation Needs:    Lack of Transportation (Medical): Not on file   Lack of Transportation (Non-Medical): Not on file  Physical Activity:    Days of Exercise per Week: Not on file   Minutes of Exercise per Session: Not on file  Stress:    Feeling of Stress : Not on file  Social Connections:    Frequency of Communication with Friends and Family: Not on file   Frequency of Social Gatherings with Friends and Family: Not on file   Attends Religious Services: Not on file   Active Member of Clubs or Organizations: Not on file   Attends Banker Meetings: Not on file   Marital Status: Not on file  Intimate Partner Violence:    Fear of Current or Ex-Partner: Not on file   Emotionally Abused: Not on file   Physically Abused: Not on file   Sexually Abused: Not on file    No Known Allergies  Current Outpatient Medications  Medication Sig Dispense Refill   aspirin 325 MG  tablet Take 325-650 mg by mouth 2 (two) times daily as needed for moderate pain (takes scheduled at least once daily). For pain     benazepril (LOTENSIN) 40 MG tablet Take 0.5 tablets (20 mg total) by mouth daily. (Needs to be seen before next refill) 15 tablet 0   dicyclomine (BENTYL) 20 MG tablet Take 1 tablet (20 mg total) by mouth 3 (three) times daily as needed for spasms. 30 tablet 3   diphenhydrAMINE (BENADRYL) 25 mg capsule Take 25 mg by mouth at bedtime as needed (for sleep.).      hydrochlorothiazide (MICROZIDE) 12.5 MG capsule Take 12.5 mg by mouth daily.     lactobacillus acidophilus (BACID) TABS tablet Take 2 tablets by mouth 3 (three) times daily.     levothyroxine (SYNTHROID) 100 MCG tablet Take 100 mcg by mouth daily  before breakfast.     Melatonin 3 MG TABS Take 3 mg by mouth at bedtime as needed (for sleep.).     No current facility-administered medications for this visit.    REVIEW OF SYSTEMS:  [X]  denotes positive finding, [ ]  denotes negative finding Cardiac  Comments:  Chest pain or chest pressure:    Shortness of breath upon exertion:    Short of breath when lying flat:    Irregular heart rhythm:        Vascular    Pain in calf, thigh, or hip brought on by ambulation:    Pain in feet at night that wakes you up from your sleep:     Blood clot in your veins:    Leg swelling:         Pulmonary    Oxygen at home:    Productive cough:     Wheezing:         Neurologic    Sudden weakness in arms or legs:     Sudden numbness in arms or legs:     Sudden onset of difficulty speaking or slurred speech:    Temporary loss of vision in one eye:     Problems with dizziness:         Gastrointestinal    Blood in stool:     Vomited blood:         Genitourinary    Burning when urinating:     Blood in urine:        Psychiatric    Major depression:         Hematologic    Bleeding problems:    Problems with blood clotting too easily:        Skin    Rashes or ulcers:        Constitutional    Fever or chills:      PHYSICAL EXAM: Vitals:   08/02/20 1532  BP: (!) 160/92  Pulse: 85  Resp: 18  Temp: 98.6 F (37 C)  TempSrc: Other (Comment)  SpO2: 92%  Weight: 149 lb (67.6 kg)  Height: 5\' 6"  (1.676 m)    GENERAL: The patient is a well-nourished female, in no acute distress. The vital signs are documented above. VASCULAR: Carotid arteries without bruits bilaterally.  Very prominent pulsation above her sternum in the midline.  Nontender.  2+ radial pulses bilaterally. PULMONARY: There is good air exchange ABDOMEN: Soft and non-tender  MUSCULOSKELETAL: There are no major deformities or cyanosis. NEUROLOGIC: No focal weakness or paresthesias are detected. SKIN: There are no ulcers  or rashes noted. PSYCHIATRIC: The patient has a normal affect.  DATA:  CT scan of her chest and neck were reviewed.  She does have moderate 65% left internal carotid artery stenosis at the bifurcation.  This is partially calcified and partially soft plaque.  No significant narrowing in her right carotid.  She has minimal calcification in her innominate artery and this is tortuous and corresponds with the pulsatile mass at the base of her neck  MEDICAL ISSUES:  I discussed these findings with the patient.  I am not concerned regarding the tortuous innominate and explained that this is simply a variation as her artery and elongates over time.  I did review symptoms of carotid disease to the patient.  She knows to present immediately to the emergency room for any focal deficits.  Otherwise we will see her again in 1 year with ultrasound to follow-up of her left carotid stenosis   Larina Earthly, MD [facility] [place] Office phone [phone]

## 2020-08-20 DIAGNOSIS — Z7189 Other specified counseling: Secondary | ICD-10-CM | POA: Diagnosis not present

## 2020-08-20 DIAGNOSIS — E0590 Thyrotoxicosis, unspecified without thyrotoxic crisis or storm: Secondary | ICD-10-CM | POA: Diagnosis not present

## 2020-08-20 DIAGNOSIS — Z79899 Other long term (current) drug therapy: Secondary | ICD-10-CM | POA: Diagnosis not present

## 2020-08-20 DIAGNOSIS — F17210 Nicotine dependence, cigarettes, uncomplicated: Secondary | ICD-10-CM | POA: Diagnosis not present

## 2020-08-20 DIAGNOSIS — I10 Essential (primary) hypertension: Secondary | ICD-10-CM | POA: Diagnosis not present

## 2020-08-20 DIAGNOSIS — Z0000 Encounter for general adult medical examination without abnormal findings: Secondary | ICD-10-CM | POA: Diagnosis not present

## 2020-08-20 DIAGNOSIS — Z299 Encounter for prophylactic measures, unspecified: Secondary | ICD-10-CM | POA: Diagnosis not present

## 2021-06-14 DIAGNOSIS — K561 Intussusception: Secondary | ICD-10-CM | POA: Diagnosis not present

## 2021-06-14 DIAGNOSIS — K56699 Other intestinal obstruction unspecified as to partial versus complete obstruction: Secondary | ICD-10-CM | POA: Diagnosis not present

## 2021-06-14 DIAGNOSIS — Z720 Tobacco use: Secondary | ICD-10-CM | POA: Diagnosis not present

## 2021-06-14 DIAGNOSIS — K6389 Other specified diseases of intestine: Secondary | ICD-10-CM | POA: Diagnosis not present

## 2021-06-14 DIAGNOSIS — K5939 Other megacolon: Secondary | ICD-10-CM | POA: Diagnosis not present

## 2021-06-14 DIAGNOSIS — K5730 Diverticulosis of large intestine without perforation or abscess without bleeding: Secondary | ICD-10-CM | POA: Diagnosis not present

## 2021-06-14 DIAGNOSIS — K56609 Unspecified intestinal obstruction, unspecified as to partial versus complete obstruction: Secondary | ICD-10-CM | POA: Diagnosis not present

## 2021-06-14 DIAGNOSIS — I10 Essential (primary) hypertension: Secondary | ICD-10-CM | POA: Diagnosis not present

## 2021-06-14 DIAGNOSIS — R1032 Left lower quadrant pain: Secondary | ICD-10-CM | POA: Diagnosis not present

## 2021-06-14 DIAGNOSIS — Z5329 Procedure and treatment not carried out because of patient's decision for other reasons: Secondary | ICD-10-CM | POA: Diagnosis not present

## 2021-06-14 DIAGNOSIS — E039 Hypothyroidism, unspecified: Secondary | ICD-10-CM | POA: Diagnosis not present

## 2021-06-14 DIAGNOSIS — K8020 Calculus of gallbladder without cholecystitis without obstruction: Secondary | ICD-10-CM | POA: Diagnosis not present

## 2021-06-14 DIAGNOSIS — E785 Hyperlipidemia, unspecified: Secondary | ICD-10-CM | POA: Diagnosis not present

## 2021-06-14 DIAGNOSIS — R39198 Other difficulties with micturition: Secondary | ICD-10-CM | POA: Diagnosis not present

## 2021-06-17 DIAGNOSIS — I700 Atherosclerosis of aorta: Secondary | ICD-10-CM | POA: Diagnosis not present

## 2021-06-17 DIAGNOSIS — D692 Other nonthrombocytopenic purpura: Secondary | ICD-10-CM | POA: Diagnosis not present

## 2021-06-17 DIAGNOSIS — K56609 Unspecified intestinal obstruction, unspecified as to partial versus complete obstruction: Secondary | ICD-10-CM | POA: Diagnosis not present

## 2021-06-17 DIAGNOSIS — I10 Essential (primary) hypertension: Secondary | ICD-10-CM | POA: Diagnosis not present

## 2021-06-17 DIAGNOSIS — Z299 Encounter for prophylactic measures, unspecified: Secondary | ICD-10-CM | POA: Diagnosis not present

## 2021-06-21 DIAGNOSIS — I2510 Atherosclerotic heart disease of native coronary artery without angina pectoris: Secondary | ICD-10-CM | POA: Diagnosis not present

## 2021-06-21 DIAGNOSIS — Z6823 Body mass index (BMI) 23.0-23.9, adult: Secondary | ICD-10-CM | POA: Diagnosis not present

## 2021-06-21 DIAGNOSIS — Z7189 Other specified counseling: Secondary | ICD-10-CM | POA: Diagnosis not present

## 2021-06-21 DIAGNOSIS — Z299 Encounter for prophylactic measures, unspecified: Secondary | ICD-10-CM | POA: Diagnosis not present

## 2021-06-21 DIAGNOSIS — F17210 Nicotine dependence, cigarettes, uncomplicated: Secondary | ICD-10-CM | POA: Diagnosis not present

## 2021-06-21 DIAGNOSIS — I10 Essential (primary) hypertension: Secondary | ICD-10-CM | POA: Diagnosis not present

## 2021-06-21 DIAGNOSIS — Z0000 Encounter for general adult medical examination without abnormal findings: Secondary | ICD-10-CM | POA: Diagnosis not present

## 2021-06-21 DIAGNOSIS — Z2821 Immunization not carried out because of patient refusal: Secondary | ICD-10-CM | POA: Diagnosis not present

## 2021-07-14 ENCOUNTER — Encounter: Payer: Self-pay | Admitting: General Surgery

## 2021-07-14 ENCOUNTER — Other Ambulatory Visit: Payer: Self-pay

## 2021-07-14 ENCOUNTER — Ambulatory Visit: Payer: Medicare Other | Admitting: General Surgery

## 2021-07-14 VITALS — BP 156/72 | HR 70 | Temp 98.9°F | Resp 16 | Ht 66.0 in | Wt 139.0 lb

## 2021-07-14 DIAGNOSIS — K8020 Calculus of gallbladder without cholecystitis without obstruction: Secondary | ICD-10-CM | POA: Insufficient documentation

## 2021-07-14 NOTE — Progress Notes (Signed)
[facility] History and Physical  Reason for Referral: Gallstones, SBO on CT  Referring Physician: Glenda Chroman, MD  Chief Complaint   New Patient (Initial Visit)     Erin Watkins is a 75 y.o. female.  HPI: Erin Watkins is a 75 yo who was recently seen at [facility] complaints of pretty severe lower abdominal pain. She reports she had a BM that morning and no nausea or vomiting. She denies any abdominal surgeries. She is the primary caretaker for her husband who had an extensive surgery and complications in August, and had to leave the ED AMA after the CT report questioned dilated small bowel and possible obstruction but no transition point (per the report).  She has a history of IBS but no history of any IBD, no bloody Bms. She had a prior colonoscopy that was unremarkable.   She has chronic diarrhea with occasional constipation with her IBS. She denies any epigastric or RUQ pain, no issues with bloating or nausea. She does not have any symptoms with greasy or fatty foods. Currently her has no abdominal pain and she is eating and drinking and having her normal diarrhea. She had never had prior concern for any SBO on CT.   Past Medical History:  Diagnosis Date   Anxiety    GERD (gastroesophageal reflux disease)    Hypercholesteremia    Hypertension    Hypothyroidism    Thyroid disease     Past Surgical History:  Procedure Laterality Date   breast biospy     COLONOSCOPY N/A 10/12/2017   Procedure: COLONOSCOPY;  Surgeon: Rogene Houston, MD;  Location: [facility];  Service: Endoscopy;  Laterality: N/A;  10:30    Family History  Problem Relation Age of Onset   Diabetes Grandson    Colon cancer Neg Hx     Social History   Tobacco Use   Smoking status: Every Day    Packs/day: 0.50    Years: 30.00    Pack years: 15.00    Types: Cigarettes   Smokeless tobacco: Never  Vaping Use   Vaping Use: Never used  Substance Use Topics   Alcohol use:  No   Drug use: No    Medications: I have reviewed the patient's current medications. Allergies as of 07/14/2021   No Known Allergies      Medication List        Accurate as of July 14, 2021  1:48 PM. If you have any questions, ask your nurse or doctor.          amLODipine 5 MG tablet Commonly known as: NORVASC Take 5 mg by mouth daily.   aspirin 325 MG tablet Take 325-650 mg by mouth 2 (two) times daily as needed for moderate pain (takes scheduled at least once daily). For pain   benazepril 40 MG tablet Commonly known as: LOTENSIN Take 0.5 tablets (20 mg total) by mouth daily. (Needs to be seen before next refill)   dicyclomine 20 MG tablet Commonly known as: BENTYL Take 1 tablet (20 mg total) by mouth 3 (three) times daily as needed for spasms.   diphenhydrAMINE 25 mg capsule Commonly known as: BENADRYL Take 25 mg by mouth at bedtime as needed (for sleep.).   hydrochlorothiazide 12.5 MG capsule Commonly known as: MICROZIDE Take 12.5 mg by mouth daily.   lactobacillus acidophilus Tabs tablet Take 2 tablets by mouth 3 (three) times daily.   levothyroxine 100 MCG tablet Commonly known as: SYNTHROID Take  100 mcg by mouth daily before breakfast.   melatonin 3 MG Tabs tablet Take 3 mg by mouth at bedtime as needed (for sleep.).         ROS:  A comprehensive review of systems was negative except for: Gastrointestinal: positive for reflux symptoms Musculoskeletal: positive for back pain  Blood pressure (!) 156/72, pulse 70, temperature 98.9 F (37.2 C), temperature source Other (Comment), resp. rate 16, height _0  (1.676 m), weight 139 lb (63 kg), SpO2 94 %. Physical Exam  Results: [facility] [place] CT report from [facility] 06/14/2021 CLINICAL DATA:  Left lower quadrant pain   EXAM:  CT ABDOMEN AND PELVIS WITH CONTRAST   TECHNIQUE:  Multidetector CT imaging of the abdomen and pelvis was performed  using the standard protocol following bolus  administration of  intravenous contrast.   CONTRAST:  80 mL Omnipaque 350 intravenous   COMPARISON:  CT 01/31/2015   FINDINGS:  Lower chest: Lung bases demonstrate dependent atelectasis. No acute  consolidation or effusion. Normal cardiac size.   Hepatobiliary: Contracted gallbladder with gallstone. No biliary  dilatation. No focal hepatic abnormality   Pancreas: Unremarkable. No pancreatic ductal dilatation or  surrounding inflammatory changes.   Spleen: Normal in size without focal abnormality.   Adrenals/Urinary Tract: Adrenal glands are normal. Kidneys show no  hydronephrosis. Left parapelvic cysts. Cyst at the lower pole left  kidney. The bladder contains a Foley catheter   Stomach/Bowel: Moderate gastric distension. Contrast reaches the mid  small bowel. Short segment small-bowel intussusception in the right  mid abdomen, series 2, image 38 but no obstruction related to this  finding. Multiple dilated loops of mid to distal small bowel  measuring up to 3.1 cm. Negative appendix. Poorly identified  transition point. No acute bowel wall thickening. Mesenteric  vascular congestion of the dilated small bowel loops. There is fluid  within the colon. Diverticular disease of the sigmoid colon.   Vascular/Lymphatic: Moderate aortic atherosclerosis. No aneurysm. No  suspicious nodes.   Reproductive: Uterus and bilateral adnexa are unremarkable.   Other: Negative for free air. Small amount of free fluid within the  pelvis, and within the bilateral gutters. Small amount of  perihepatic free fluid.   Musculoskeletal: No acute or suspicious osseous abnormality.   IMPRESSION:  1. There are multiple dilated fluid-filled loops of mid to distal  small bowel within the lower abdomen and pelvis, with associated  mesenteric vascular congestion. Findings are suspicious for bowel  obstruction though transition point is not well identified. There is  no bowel wall thickening or  evidence for intramural air at this  time. There is small amount of free fluid in the abdomen and pelvis  but no free air. There is a short segment of small bowel  intussusception in the right mid abdomen but there is no obstruction  related to this finding  2. Diverticular disease of the sigmoid colon without acute wall  thickening  3. Gallstone    Electronically Signed    By: Donavan Foil M.D.    On: 06/14/2021 22:19  [facility] 06/14/2021  Component 06/14/2021     Sodium 139  Potassium 3.8  Chloride 103  CO2 29.5  Anion Gap 7  BUN 13  Creatinine 1.02  BUN/Creatinine Ratio 13  eGFR CKD-EPI (2021) Female 58 Low      Glucose 110  Calcium 9.4  Albumin 4.2  Total Protein 7.7  Total Bilirubin 0.4  AST 17  ALT 22  Alkaline Phosphatase 63  Component 06/14/2021     WBC 9.0  RBC 5.24 High     HGB 14.9  HCT 44.4 High     MCV 84.7  MCH 28.4  MCHC 33.6  RDW 13.4  MPV 10.5 High     Platelet 264    Assessment & Plan:  BRANDIS WIXTED is a 75 y.o. female with a visit to OSH ED. I can see the report but no the images. She had concern for dilated SB and some mesenteric edema. She had no transition point. She had an area of intussusception but this was not causing SBO and this was likely a transient finding on CT (seen often on trauma Cts). She has gallstones but no gallbladder symptoms. She had no clinical symptoms of obstruction. Discussed that scar tissue is usually the cause of SBO and that is from surgery, but that some people can have from prior infections. Discussed that if this occurs again, another CT may tell us more and that often these are managed non operatively. Discussed that there is no surgical intervention needed currently as she has no symptoms then or now of SBO and only the findings on the CT. I suspect she had some GI bug and dilated bowel related to this and the intussusception was incidental (again not related to obstruction). None of the  symptoms go along with gallstones. Discussed gallstones and symptoms.    PRN Follow up May need repeat imaging if something changes  No indication for surgery at this time   All questions were answered to the satisfaction of the patient.     Virl Cagey 07/14/2021, 1:48 PM

## 2021-07-14 NOTE — Patient Instructions (Signed)
Cholelithiasis Cholelithiasis is a disease in which gallstones form in the gallbladder. The gallbladder is an organ that stores bile. Bile is a fluid that helps to digest fats. Gallstones begin as small crystals and can slowly grow into stones. They may cause no symptoms until they block the gallbladder duct, or cystic duct, when the gallbladder tightens (contracts) after food is eaten. This can cause pain and is known as a gallbladder attack, or biliary colic. There are two main types of gallstones: Cholesterol stones. These are the most common type of gallstone. These stones are made of hardened cholesterol and are usually yellow-green in color. Cholesterol is a fat-like substance that is made in the liver. Pigment stones. These are dark in color and are made of a red-yellow substance, called bilirubin,that forms when hemoglobin from red blood cells breaks down. What are the causes? This condition may be caused by an imbalance in the different parts that make bile. This can happen if the bile: Has too much bilirubin. This can happen in certain blood diseases, such as sickle cell anemia. Has too much cholesterol. Does not have enough bile salts. These salts help the body absorb and digest fats. In some cases, this condition can also be caused by the gallbladder not emptying completely or often enough. This is common during pregnancy. What increases the risk? The following factors may make you more likely to develop this condition: Being female. Having multiple pregnancies. Health care providers sometimes advise removing diseased gallbladders before future pregnancies. Eating a diet that is heavy in fried foods, fat, and refined carbohydrates, such as white bread and white rice. Being obese. Being older than age 56. Using medicines that contain female hormones (estrogen) for a long time. Losing weight quickly. Having a family history of gallstones. Having certain medical problems, such  as: Diabetes mellitus. Cystic fibrosis. Crohn's disease. Cirrhosis or other long-term (chronic) liver disease. Certain blood diseases, such as sickle cell anemia or leukemia. What are the signs or symptoms? In many cases, having gallstones causes no symptoms. When you have gallstones but do not have symptoms, you have silent gallstones. If a gallstone blocks your bile duct, it can cause a gallbladder attack. The main symptom of a gallbladder attack is sudden pain in the upper right part of the abdomen. The pain: Usually comes at night or after eating. Can last for one hour or more. Can spread to your right shoulder, back, or chest. Can feel like indigestion. This is discomfort, burning, or fullness in your upper abdomen. If the bile duct is blocked for more than a few hours, it can cause an infection or inflammation of your gallbladder (cholecystitis), liver, or pancreas. This can cause: Nausea or vomiting. Bloating. Pain in your abdomen that lasts for 5 hours or longer. Tenderness in your upper abdomen, often in the upper right section and under your rib cage. Fever or chills. Skin or the white parts of your eyes turning yellow (jaundice). This usually happens when a stone has blocked bile from passing through the common bile duct. Dark urine or light-colored stools. How is this diagnosed? This condition may be diagnosed based on: A physical exam. Your medical history. Ultrasound. CT scan. MRI. You may also have other tests, including: Blood tests to check for signs of an infection or inflammation. Cholescintigraphy, or HIDA scan. This is a scan of your gallbladder and bile ducts (biliary system) using non-harmful radioactive material and special cameras that can see the radioactive material. Endoscopic retrograde cholangiopancreatogram. This involves  inserting a small tube with a camera on the end (endoscope) through your mouth to look at bile ducts and check for blockages. How is  this treated? Treatment for this condition depends on the severity of the condition. Silent gallstones do not need treatment. Treatment may be needed if a blockage causes a gallbladder attack or other symptoms. Treatment may include: Home care, if symptoms are not severe. During a simple gallbladder attack, stop eating and drinking for 12-24 hours (except for water and clear liquids). This helps to "cool down" your gallbladder. After 1 or 2 days, you can start to eat a diet of simple or clear foods, such as broths and crackers. You may also need medicines for pain or nausea or both. If you have cholecystitis and an infection, you will need antibiotics. A hospital stay, if needed for pain control or for cholecystitis with severe infection. Cholecystectomy, or surgery to remove your gallbladder. This is the most common treatment if all other treatments have not worked. Medicines to break up gallstones. These are most effective at treating small gallstones. Medicines may be used for up to 6-12 months. Endoscopic retrograde cholangiopancreatogram. A small basket can be attached to the endoscope and used to capture and remove gallstones, mainly those that are in the common bile duct. Follow these instructions at home: Medicines Take over-the-counter and prescription medicines only as told by your health care provider. If you were prescribed an antibiotic medicine, take it as told by your health care provider. Do not stop taking the antibiotic even if you start to feel better. Ask your health care provider if the medicine prescribed to you requires you to avoid driving or using machinery. Eating and drinking Drink enough fluid to keep your urine pale yellow. This is important during a gallbladder attack. Water and clear liquids are preferred. Follow a healthy diet. This includes: Reducing fatty foods, such as fried food and foods high in cholesterol. Reducing refined carbohydrates, such as white bread  and white rice. Eating more fiber. Aim for foods such as almonds, fruit, and beans. Alcohol use If you drink alcohol: Limit how much you use to: 0-1 drink a day for nonpregnant women. 0-2 drinks a day for men. Be aware of how much alcohol is in your drink. In the [place] one drink equals one 12 oz bottle of beer (355 mL), one 5 oz glass of wine (148 mL), or one 1 oz glass of hard liquor (44 mL). General instructions Do not use any products that contain nicotine or tobacco, such as cigarettes, e-cigarettes, and chewing tobacco. If you need help quitting, ask your health care provider. Maintain a healthy weight. Keep all follow-up visits as told by your health care provider. These may include consultations with a surgeon or specialist. This is important. Where to find more information [employer] of Diabetes and Digestive and Kidney Diseases: [email or web address] Contact a health care provider if: You think you have had a gallbladder attack. You have been diagnosed with silent gallstones and you develop pain in your abdomen or indigestion. You begin to have attacks more often. You have dark urine or light-colored stools. Get help right away if: You have pain from a gallbladder attack that lasts for more than 2 hours. You have pain in your abdomen that lasts for more than 5 hours or is getting worse. You have a fever or chills. You have nausea and vomiting that do not go away. You develop jaundice. Summary Cholelithiasis is a disease in which gallstones  form in the gallbladder. This condition may be caused by an imbalance in the different parts that make bile. This can happen if your bile has too much bilirubin or cholesterol, or does not have enough bile salts. Treatment for gallstones depends on the severity of the condition. Silent gallstones do not need treatment. If gallstones cause a gallbladder attack or other symptoms, treatment usually involves not eating or drinking anything.  Treatment may also include pain medicines and antibiotics, and it sometimes includes a hospital stay. Surgery to remove the gallbladder is common if all other treatments have not worked. This information is not intended to replace advice given to you by your health care provider. Make sure you discuss any questions you have with your health care provider. Document Revised: 07/28/2019 Document Reviewed: 07/28/2019 Elsevier Patient Education  2022 [employer] Bowel Obstruction A bowel obstruction is a blockage in the small or large bowel. The bowel is also called the intestine. It is a long tube that connects the stomach to the anus. When a person eats and drinks, food and fluids go from the mouth to the stomach to the small bowel. This is where most of the nutrients in the food and fluids are absorbed. After the small bowel, material passes through the large bowel for further absorption until any leftover material leaves the body as stool (feces) through the anus during a bowel movement. A bowel obstruction will prevent food and fluids from passing through the bowel as they normally do during digestion. The bowel can become partially or completely blocked. If this condition is not treated, it can be dangerous because the bowel could rupture. What are the causes? Common causes of this condition include: Scar tissue in the body (adhesions) from previous surgery or treatment with high-energy X-rays (radiation). Recent surgery. This may cause the movements of the bowel to slow down and cause food to block the intestine. Inflammatory bowel disease, such as Crohn's disease or diverticulitis. Growths or tumors. A bulging organ or tissue (hernia). Twisting of the bowel (volvulus). A swallowed object (foreign body). Slipping of a part of the bowel into another part (intussusception). What are the signs or symptoms? Symptoms of this condition include: Pain in the abdomen. Depending on the degree of  obstruction, pain may be: Mild or severe. Dull cramping or sharp pain. In one area or in the entire abdomen. Nausea and vomiting. Vomit may be greenish or a yellow bile color. Bloating in the abdomen. Constipation. Being unable to pass gas. Frequent belching. Diarrhea. This may occur if the obstruction is partial and runny stool is able to leak around the obstruction. How is this diagnosed? This condition may be diagnosed based on: A physical exam. Your medical history. Exams to look into the small intestine or the large intestine (endoscopy or colonoscopy). Imaging tests of the abdomen or pelvis, such as X-ray or CT scan. Blood or urine tests. How is this treated? Treatment for this condition depends on the cause and severity of the problem. Treatment may include: Fluids and pain medicines that are given through an IV. Your health care provider may tell you not to eat or drink if you have nausea or vomiting. A clear liquid diet. You may be asked to consume a clear liquid diet for several days. This allows the bowel to rest. Placement of a small tube (nasogastric tube) through the nose, down the throat, and into the stomach. This may relieve pain, discomfort, and nausea by removing blocked air and fluids from  the stomach. It can also help the obstruction clear up faster. Surgery. This may be required if other treatments do not work. Surgery may be required for: Bowel obstruction from a hernia. This can be an emergency procedure. Scar tissue that causes frequent or severe obstructions. Follow these instructions at home: Medicines Take over-the-counter and prescription medicines only as told by your health care provider. If you were prescribed an antibiotic medicine, take it as told by your health care provider. Do not stop taking the antibiotic even if you start to feel better. General instructions Follow instructions from your health care provider about eating and drinking restrictions.  You may need to avoid solid foods and drink only clear liquids until your condition improves. Return to your normal activities as told by your health care provider. Ask your health care provider what activities are safe for you. Rest as told by your health care provider. Avoid sitting for a long time without moving. Get up to take short walks every 1-2 hours. This is important to improve blood flow and breathing. Ask for help if you feel weak or unsteady. Keep all follow-up visits. This is important. How is this prevented? After having a bowel obstruction, you are more likely to have another. You may do the following things to prevent another obstruction: If you have a long-term (chronic) disease, pay attention to your symptoms and contact your health care provider if you have questions or concerns. Avoid becoming constipated. You may need to take these actions to prevent or treat constipation: Drink enough fluid to keep your urine pale yellow. Take over-the-counter or prescription medicines. Eat foods that are high in fiber, such as beans, whole grains, and fresh fruits and vegetables. Limit foods that are high in fat and processed sugars, such as fried or sweet foods. Stay active. Exercise for 30 minutes or more, 5 or more days each week. Ask your health care provider which exercises are safe for you. Avoid stress. Find ways to reduce stress, such as meditation, exercise, or taking time for activities that relax you. Instead of eating three large meals each day, eat three small meals with three small snacks. Work with a Data processing manager to make a healthy meal plan that works for you. Do not use any products that contain nicotine or tobacco. These products include cigarettes, chewing tobacco, and vaping devices, such as e-cigarettes. If you need help quitting, ask your health care provider. Contact a health care provider if: You have a fever. You have chills. Get help right away if: You have  increased pain or cramping. You vomit blood. You have uncontrolled vomiting or nausea. You cannot drink fluids because of vomiting or pain. You become confused. You begin feeling very thirsty (dehydrated). You have severe bloating. You feel extremely weak or you faint. Summary A bowel obstruction is a blockage in the small or large bowel. A bowel obstruction will prevent food and fluids from passing through the bowel as they normally do during digestion. Treatment for this condition depends on the cause and severity of the problem. It may include fluids and pain medicines through an IV, a simple diet, a nasogastric tube, or surgery. Follow instructions from your health care provider about eating restrictions. You may need to avoid solid foods and consume only clear liquids until your condition improves. This information is not intended to replace advice given to you by your health care provider. Make sure you discuss any questions you have with your health care provider. Document Revised: 10/17/2020  Document Reviewed: 10/17/2020 Elsevier Patient Education  2022 [employer].

## 2021-09-21 DIAGNOSIS — J329 Chronic sinusitis, unspecified: Secondary | ICD-10-CM | POA: Diagnosis not present

## 2021-09-21 DIAGNOSIS — D692 Other nonthrombocytopenic purpura: Secondary | ICD-10-CM | POA: Diagnosis not present

## 2021-09-21 DIAGNOSIS — Z299 Encounter for prophylactic measures, unspecified: Secondary | ICD-10-CM | POA: Diagnosis not present

## 2021-09-21 DIAGNOSIS — I700 Atherosclerosis of aorta: Secondary | ICD-10-CM | POA: Diagnosis not present

## 2021-09-21 DIAGNOSIS — I10 Essential (primary) hypertension: Secondary | ICD-10-CM | POA: Diagnosis not present

## 2021-12-29 IMAGING — CT CT ANGIO CHEST
2 of 6 series · 16 of 36 positions shown · IV contrast (Omnipaque or Isovue)
Comparison: CTA of the neck on 07/19/2020

CLINICAL DATA: Sensation of pulse a shin near sternoclavicular
joints, right greater than left. Posterior chest wall soft tissue
swelling.

EXAM:
CT ANGIOGRAPHY CHEST WITH CONTRAST
TECHNIQUE: Multidetector CT imaging of the chest was performed using the
standard protocol during bolus administration of intravenous
contrast. Multiplanar CT image reconstructions and MIPs were
obtained to evaluate the vascular anatomy.
CONTRAST:  75mL OMNIPAQUE IOHEXOL 350 MG/ML SOLN

[Series 7: lungs · axial · 0.60mm/px · z∈[-368,-88]mm · 15 of 160 slices shown]
[im 10/160  lung]
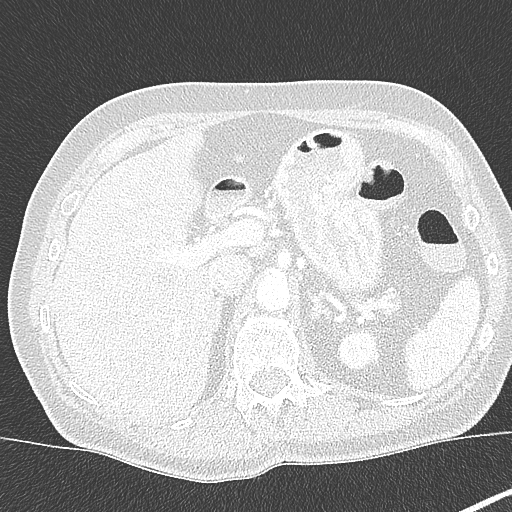
[im 20/160  mediastinal]
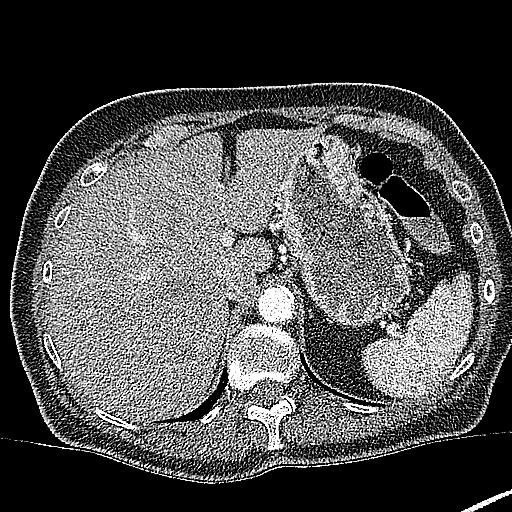
[im 30/160  lung]
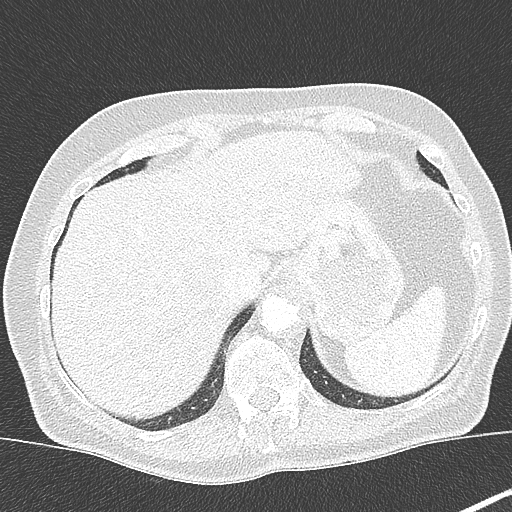
[im 40/160  mediastinal]
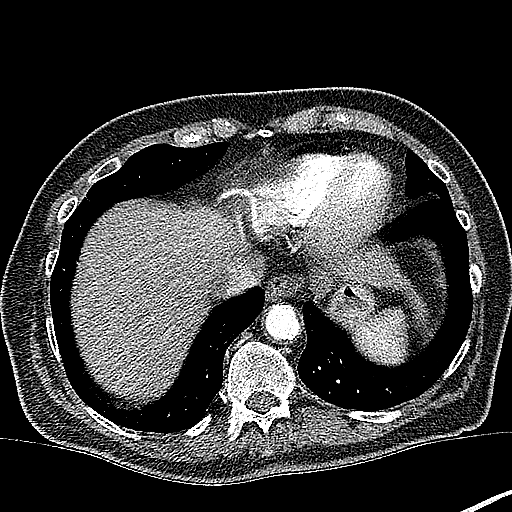
[im 50/160  lung]
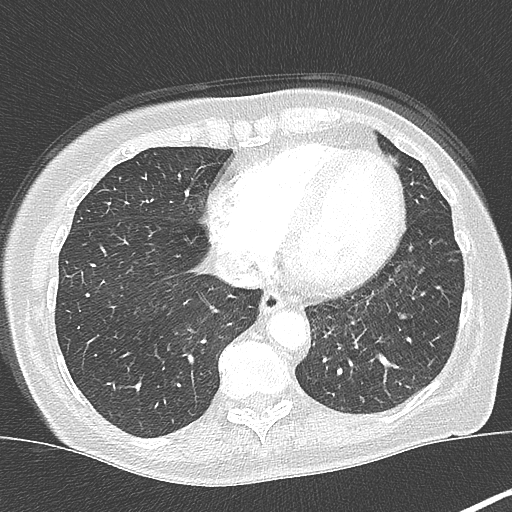
[im 60/160  mediastinal]
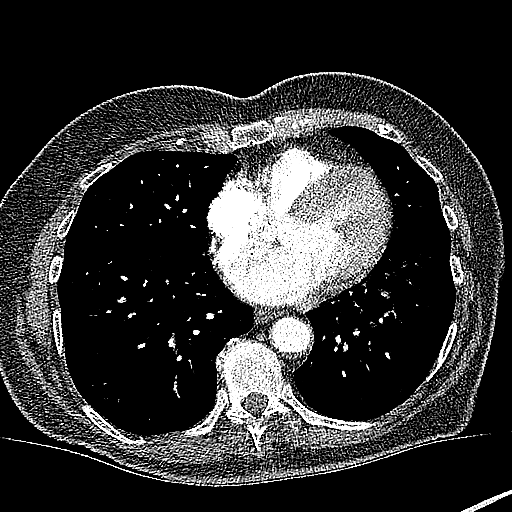
[im 70/160  lung]
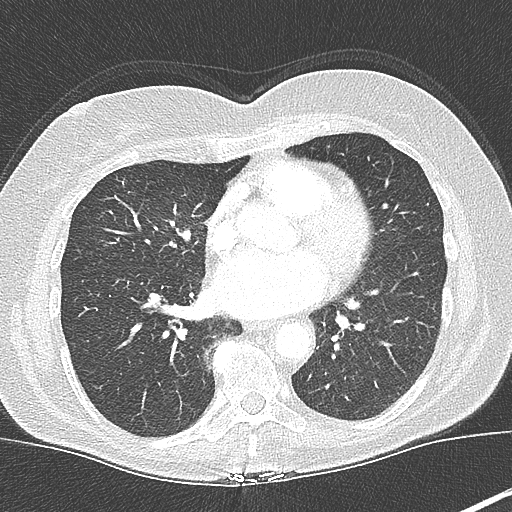
[im 80/160  mediastinal]
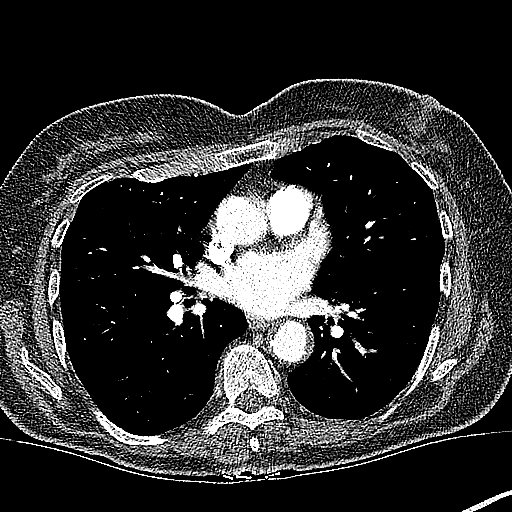
[im 90/160  lung]
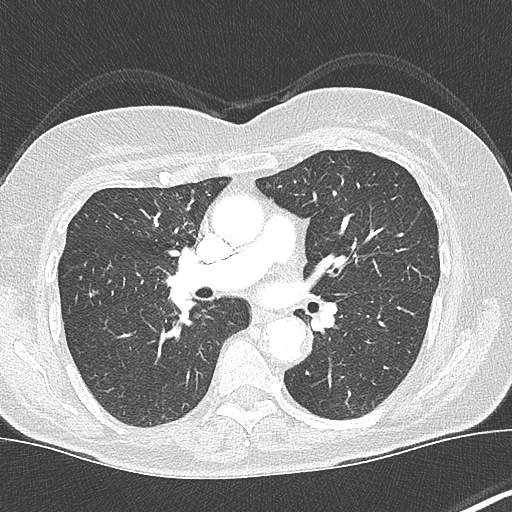
[im 100/160  mediastinal]
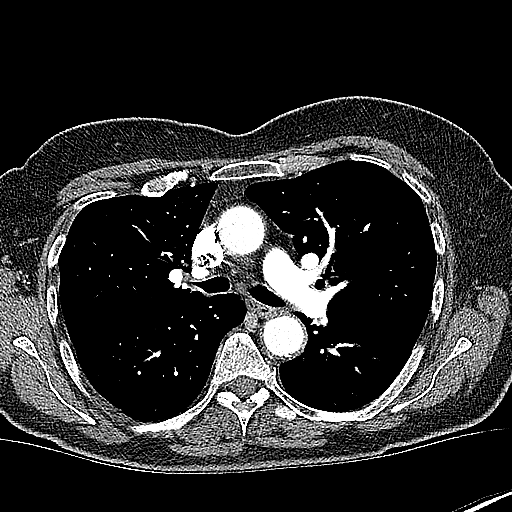
[im 110/160  lung]
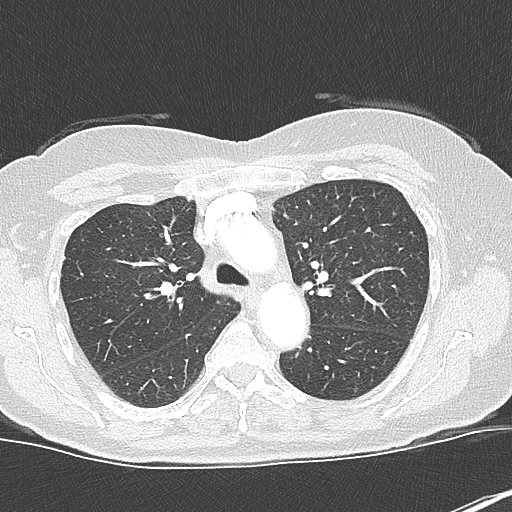
[im 120/160  mediastinal]
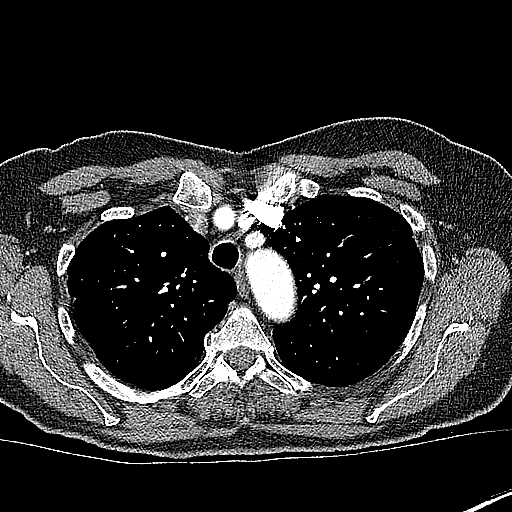
[im 130/160  lung]
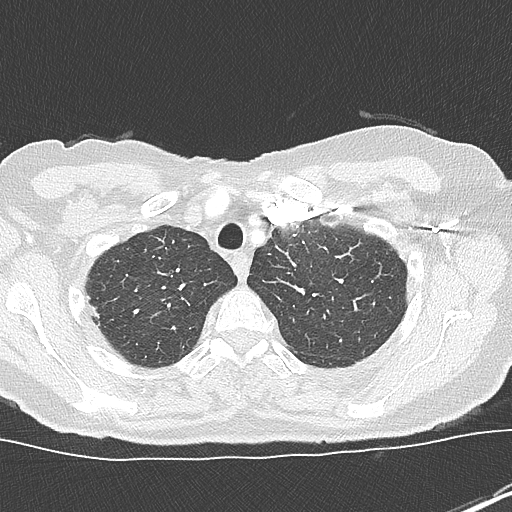
[im 140/160  mediastinal]
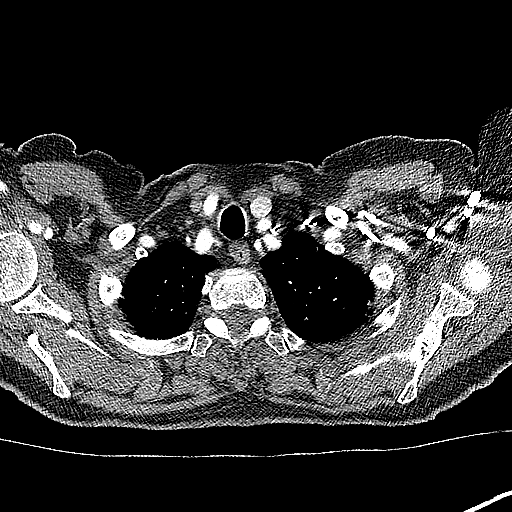
[im 150/160  lung]
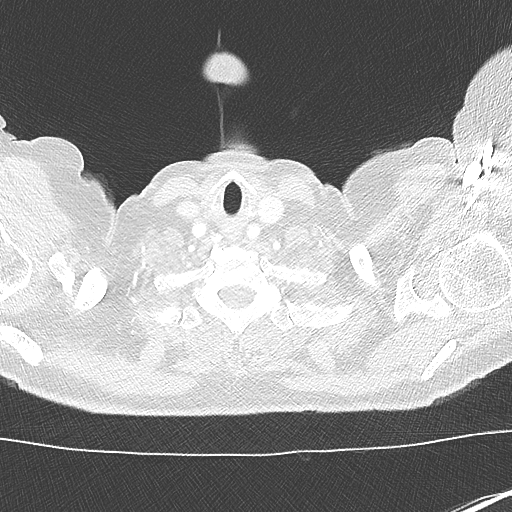

[Series 8: cor soft · coronal · 0.61mm/px · 1 of 125 slices shown]
[im 63/125  mediastinal]
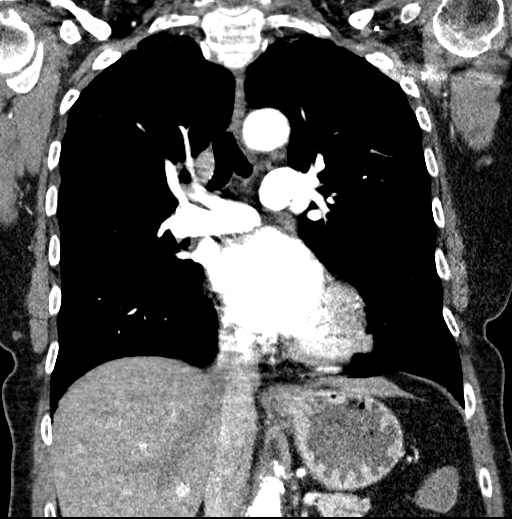

[16 of 36 positions shown; findings below may reference images not displayed]

FINDINGS: Cardiovascular: No evidence of aneurysmal disease of the thoracic
aorta. No aortic dissection. The descending thoracic aorta
demonstrates severe atherosclerosis with calcified and noncalcified
plaque present. Some of this plaque is likely ulcerated. No
penetrating ulcer disease identified.

As depicted and mentioned on the neck CTA report, there is
tortuosity of the innominate artery and proximal right common
carotid artery which courses anteriorly and likely is causing the
pulsatile sensation at the level of the suprasternal notch and
slightly to the right of midline. Proximal great vessels demonstrate
tortuosity without significant obstructive disease or aneurysmal
disease.

The heart size is within normal limits. No pericardial fluid
identified. No significant visualized calcified coronary artery
plaque. Central pulmonary arteries are normal in caliber.

Mediastinum/Nodes: No enlarged mediastinal, hilar, or axillary lymph
nodes. Thyroid gland, trachea, and esophagus demonstrate no
significant findings.

Lungs/Pleura: Mild scattered parenchymal scarring bilaterally. There
is no evidence of pulmonary edema, consolidation, pneumothorax,
nodule or pleural fluid.

Upper Abdomen: No acute abnormality. Visible calcified gallstone in
a contracted gallbladder.

Musculoskeletal: No chest wall abnormality. No acute or significant
osseous findings.

Review of the MIP images confirms the above findings.
IMPRESSION: 1. No evidence of aneurysmal disease of the thoracic aorta.
2. Severe atherosclerosis of the descending thoracic aorta with
calcified and noncalcified plaque present. Some of this plaque is
likely ulcerated. No penetrating ulcer disease identified.
3. Tortuosity of the innominate artery and proximal right common
carotid artery which courses anteriorly and likely is causing the
pulsatile sensation at the level of the suprasternal notch and
slightly to the right of midline.
4. Cholelithiasis.

## 2022-01-24 ENCOUNTER — Other Ambulatory Visit: Payer: Self-pay | Admitting: *Deleted

## 2022-01-24 ENCOUNTER — Encounter: Payer: Self-pay | Admitting: General Surgery

## 2022-01-24 ENCOUNTER — Ambulatory Visit: Payer: Medicare Other | Admitting: General Surgery

## 2022-01-24 VITALS — BP 199/95 | HR 84 | Temp 98.6°F | Resp 14 | Ht 66.0 in | Wt 139.0 lb

## 2022-01-24 DIAGNOSIS — I25119 Atherosclerotic heart disease of native coronary artery with unspecified angina pectoris: Secondary | ICD-10-CM

## 2022-01-24 DIAGNOSIS — K8020 Calculus of gallbladder without cholecystitis without obstruction: Secondary | ICD-10-CM | POA: Diagnosis not present

## 2022-01-24 NOTE — Progress Notes (Signed)
[facility] History and Physical ? ?Reason for Referral: RUQ pain, stones  ?Referring Physician: Self ? ?Chief Complaint   ?Follow-up ?  ? ? ?Erin Watkins is a 76 y.o. female.  ?HPI: Erin Watkins is known to me after seeing me back in 2022 for a CT scan that was concerned for possible obstruction and a little transient small bowel intussusception but clinically in the ED she was not obstructed. She was also noted to have gallstones on that imaging. When she saw me she had no RUQ pain or complaints and had been having her standard IBS diarrhea symptoms. 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 She has no symptoms of any obstruction but she is starting to have RUQ pain and some nausea/ bloating after eating. She is also have more reflux symptoms that prior. ? ?Past Medical History:  ?Diagnosis Date  ? Anxiety   ? GERD (gastroesophageal reflux disease)   ? Hypercholesteremia   ? Hypertension   ? Hypothyroidism   ? Thyroid disease   ? ? ?Past Surgical History:  ?Procedure Laterality Date  ? breast biospy    ? COLONOSCOPY N/A 10/12/2017  ? Procedure: COLONOSCOPY;  Surgeon: Malissa Hippo, MD;  Location: [facility];  Service: Endoscopy;  Laterality: N/A;  10:30  ? ? ?Family History  ?Problem Relation Age of Onset  ? Diabetes Grandson   ? Colon cancer Neg Hx   ? ? ?Social History  ? ?Tobacco Use  ? Smoking status: Every Day  ?  Packs/day: 0.50  ?  Years: 30.00  ?  Pack years: 15.00  ?  Types: Cigarettes  ? Smokeless tobacco: Never  ?Vaping Use  ? Vaping Use: Never used  ?Substance Use Topics  ? Alcohol use: No  ? Drug use: No  ? ? ?Medications: I have reviewed the patient's current medications. ?Allergies as of 01/24/2022   ?No Known Allergies ?  ? ?  ?Medication List  ?  ? ?  ? Accurate as of Jan 24, 2022 10:17 AM. If you have any questions, ask your nurse or doctor.  ?  ?  ? ?  ? ?STOP taking these medications   ? ?dicyclomine 20 MG tablet ?Commonly known as: BENTYL ?Stopped by:  Lucretia Roers, MD ?  ? ?  ? ?TAKE these medications   ? ?amLODipine 5 MG tablet ?Commonly known as: NORVASC ?Take 5 mg by mouth daily. ?  ?aspirin 325 MG tablet ?Take 325-650 mg by mouth 2 (two) times daily as needed for moderate pain (takes scheduled at least once daily). For pain ?  ?benazepril 40 MG tablet ?Commonly known as: LOTENSIN ?Take 0.5 tablets (20 mg total) by mouth daily. (Needs to be seen before next refill) ?  ?diphenhydrAMINE 25 mg capsule ?Commonly known as: BENADRYL ?Take 25 mg by mouth at bedtime as needed (for sleep.). ?  ?hydrochlorothiazide 12.5 MG capsule ?Commonly known as: MICROZIDE ?Take 12.5 mg by mouth daily. ?  ?lactobacillus acidophilus Tabs tablet ?Take 2 tablets by mouth 3 (three) times daily. ?  ?levothyroxine 100 MCG tablet ?Commonly known as: SYNTHROID ?Take 100 mcg by mouth daily before breakfast. ?  ?melatonin 3 MG Tabs tablet ?Take 3 mg by mouth at bedtime as needed (for sleep.). ?  ? ?  ? ? ? ?ROS:  ?A comprehensive review of systems was negative except for: Gastrointestinal: positive for abdominal pain, diarrhea, nausea, and reflux symptoms ? ?Blood pressure (!) 199/95, pulse 84, temperature 98.6 ?  F (37 ?C), temperature source Oral, resp. rate 14, height 5\' 6"  (1.676 m), weight 139 lb (63 kg), SpO2 95 %. ?Physical Exam ?Vitals reviewed.  ?Constitutional:   ?   Appearance: Normal appearance.  ?HENT:  ?   Head: Normocephalic.  ?   Mouth/Throat:  ?   Mouth: Mucous membranes are moist.  ?Eyes:  ?   Extraocular Movements: Extraocular movements intact.  ?Cardiovascular:  ?   Rate and Rhythm: Normal rate and regular rhythm.  ?Pulmonary:  ?   Effort: Pulmonary effort is normal.  ?   Breath sounds: Normal breath sounds.  ?Abdominal:  ?   General: There is no distension.  ?   Palpations: Abdomen is soft.  ?   Tenderness: There is abdominal tenderness in the right upper quadrant.  ?Musculoskeletal:     ?   General: Normal range of motion.  ?Skin: ?   General: Skin is warm.   ?Neurological:  ?   General: No focal deficit present.  ?   Mental Status: She is alert.  ?Psychiatric:     ?   Mood and Affect: Mood normal.  ? ? ?Results: ? ?None ? ?Assessment & Plan:  ?Erin Watkins is a 76 y.o. female with known gallstones and now with symptoms of biliary colic. She is wanting to get her gallbladder out. She was suppose to follow up with Dr. 61 for her left carotid stenosis after her 2021 visit but never did. She has not had a repeat 2022. Discussed getting her to see him and make sure this has not progressed before planning an elective surgery given the risk of stroke.  ? ?-Discussed that after that evaluation will plan for Laparoscopic Cholecystectomy  ? ?PLAN: I counseled the patient about the indication, risks and benefits of laparoscopic cholecystectomy.  She understands there is a very small chance for bleeding, infection, injury to normal structures (including common bile duct), conversion to open surgery, persistent symptoms, evolution of postcholecystectomy diarrhea, need for secondary interventions, anesthesia reaction, cardiopulmonary issues and other risks not specifically detailed here. I described the expected recovery, the plan for follow-up and the restrictions during the recovery phase.  All questions were answered. ? ?Will schedule her once she sees Dr. [place] and her Arbie Cookey is completed.  ? ? ?[place] ?01/24/2022, 10:17 AM  ? ? ? ? ? ?

## 2022-01-24 NOTE — Patient Instructions (Addendum)
Will get Dr. Arbie Cookey to see you to make sure left carotid stenosis is stable and everything is good prior to any elective gallbladder surgery.  ? ?Minimally Invasive Cholecystectomy ?Minimally invasive cholecystectomy is surgery to remove the gallbladder. The gallbladder is a pear-shaped organ that lies beneath the liver on the right side of the body. The gallbladder stores bile, which is a fluid that helps the body digest fats. Cholecystectomy is often done to treat inflammation (irritation and swelling) of the gallbladder (cholecystitis). This condition is usually caused by a buildup of gallstones (cholelithiasis) in the gallbladder or when the fluid in the gall bladder becomes stagnant because gallstones get stuck in the ducts (tubes) and block the flow of bile. This can result in inflammation and pain. In severe cases, emergency surgery may be required. ?This procedure is done through small incisions in the abdomen, instead of one large incision. It is also called laparoscopic surgery. A thin scope with a camera (laparoscope) is inserted through one incision. Then surgical instruments are inserted through the other incisions. In some cases, a minimally invasive surgery may need to be changed to a surgery that is done through a larger incision. This is called open surgery. ?Tell a health care provider about: ?Any allergies you have. ?All medicines you are taking, including vitamins, herbs, eye drops, creams, and over-the-counter medicines. ?Any problems you or family members have had with anesthetic medicines. ?Any bleeding problems you have. ?Any surgeries you have had. ?Any medical conditions you have. ?Whether you are pregnant or may be pregnant. ?What are the risks? ?Generally, this is a safe procedure. However, problems may occur, including: ?Infection. ?Bleeding. ?Allergic reactions to medicines. ?Damage to nearby structures or organs. ?A gallstone remaining in the common bile duct. The common bile duct  carries bile from the gallbladder to the small intestine. ?A bile leak from the liver or cystic duct after your gallbladder is removed. ?What happens before the procedure? ?Medicines ?Ask your health care provider about: ?Changing or stopping your regular medicines. This is especially important if you are taking diabetes medicines or blood thinners. ?Taking medicines such as aspirin and ibuprofen. These medicines can thin your blood. Do not take these medicines unless your health care provider tells you to take them. ?Taking over-the-counter medicines, vitamins, herbs, and supplements. ?General instructions ?If you will be going home right after the procedure, plan to have a responsible adult: ?Take you home from the hospital or clinic. You will not be allowed to drive. ?Care for you for the time you are told. ?Do not use any products that contain nicotine or tobacco for at least 4 weeks before the procedure. These products include cigarettes, chewing tobacco, and vaping devices, such as e-cigarettes. If you need help quitting, ask your health care provider. ?Ask your health care provider: ?How your surgery site will be marked. ?What steps will be taken to help prevent infection. These may include: ?Removing hair at the surgery site. ?Washing skin with a germ-killing soap. ?Taking antibiotic medicine. ?What happens during the procedure? ? ?An IV will be inserted into one of your veins. ?You will be given one or both of the following: ?A medicine to help you relax (sedative). ?A medicine to make you fall asleep (general anesthetic). ?Your surgeon will make several small incisions in your abdomen. ?The laparoscope will be inserted through one of the small incisions. The camera on the laparoscope will send images to a monitor in the operating room. This lets your surgeon see inside your  abdomen. ?A gas will be pumped into your abdomen. This will expand your abdomen to give the surgeon more room to perform the  surgery. ?Other tools that are needed for the procedure will be inserted through the other incisions. The gallbladder will be removed through one of the incisions. ?Your common bile duct may be examined. If stones are found in the common bile duct, they may be removed. ?After your gallbladder has been removed, the incisions will be closed with stitches (sutures), staples, or skin glue. ?Your incisions will be covered with a bandage (dressing). ?The procedure may vary among health care providers and hospitals. ?What happens after the procedure? ?Your blood pressure, heart rate, breathing rate, and blood oxygen level will be monitored until you leave the hospital or clinic. ?You will be given medicines as needed to control your pain. ?You may have a drain placed in the incision. The drain will be removed a day or two after the procedure. ?Summary ?Minimally invasive cholecystectomy, also called laparoscopic cholecystectomy, is surgery to remove the gallbladder using small incisions. ?Tell your health care provider about all the medical conditions you have and all the medicines you are taking for those conditions. ?Before the procedure, follow instructions about when to stop eating and drinking and changing or stopping medicines. ?Plan to have a responsible adult care for you for the time you are told after you leave the hospital or clinic. ?This information is not intended to replace advice given to you by your health care provider. Make sure you discuss any questions you have with your health care provider. ?Document Revised: 03/08/2021 Document Reviewed: 03/08/2021 ?Elsevier Patient Education ? 2023 [employer] ?Cholelithiasis ? ?Cholelithiasis is a disease in which gallstones form in the gallbladder. The gallbladder is an organ that stores bile. Bile is a fluid that helps to digest fats. Gallstones begin as small crystals and can slowly grow into stones. They may cause no symptoms until they block the gallbladder  duct, or cystic duct, when the gallbladder tightens (contracts) after food is eaten. This can cause pain and is known as a gallbladder attack, or biliary colic. ?There are two main types of gallstones: ?Cholesterol stones. These are the most common type of gallstone. These stones are made of hardened cholesterol and are usually yellow-green in color. Cholesterol is a fat-like substance that is made in the liver. ?Pigment stones. These are dark in color and are made of a red-yellow substance, called bilirubin,that forms when hemoglobin from red blood cells breaks down. ?What are the causes? ?This condition may be caused by an imbalance in the different parts that make bile. This can happen if the bile: ?Has too much bilirubin. This can happen in certain blood diseases, such as sickle cell anemia. ?Has too much cholesterol. ?Does not have enough bile salts. These salts help the body absorb and digest fats. ?In some cases, this condition can also be caused by the gallbladder not emptying completely or often enough. This is common during pregnancy. ?What increases the risk? ?The following factors may make you more likely to develop this condition: ?Being female. ?Having multiple pregnancies. Health care providers sometimes advise removing diseased gallbladders before future pregnancies. ?Eating a diet that is heavy in fried foods, fat, and refined carbohydrates, such as white bread and white rice. ?Being obese. ?Being older than age 105. ?Using medicines that contain female hormones (estrogen) for a long time. ?Losing weight quickly. ?Having a family history of gallstones. ?Having certain medical problems, such as: ?Diabetes mellitus. ?  Cystic fibrosis. ?Crohn's disease. ?Cirrhosis or other long-term (chronic) liver disease. ?Certain blood diseases, such as sickle cell anemia or leukemia. ?What are the signs or symptoms? ?In many cases, having gallstones causes no symptoms. When you have gallstones but do not have  symptoms, you have silent gallstones. If a gallstone blocks your bile duct, it can cause a gallbladder attack. The main symptom of a gallbladder attack is sudden pain in the upper right part of the abdomen. The pain: ?[place]

## 2022-04-12 ENCOUNTER — Ambulatory Visit: Payer: Medicare Other | Admitting: Vascular Surgery

## 2022-04-12 ENCOUNTER — Ambulatory Visit (INDEPENDENT_AMBULATORY_CARE_PROVIDER_SITE_OTHER): Payer: Medicare Other

## 2022-04-12 ENCOUNTER — Other Ambulatory Visit (HOSPITAL_COMMUNITY): Payer: Self-pay | Admitting: Vascular Surgery

## 2022-04-12 ENCOUNTER — Encounter: Payer: Self-pay | Admitting: Vascular Surgery

## 2022-04-12 ENCOUNTER — Other Ambulatory Visit: Payer: Self-pay | Admitting: Vascular Surgery

## 2022-04-12 VITALS — BP 161/78 | HR 72 | Temp 98.2°F | Resp 14 | Ht 66.0 in | Wt 141.2 lb

## 2022-04-12 DIAGNOSIS — I6522 Occlusion and stenosis of left carotid artery: Secondary | ICD-10-CM

## 2022-04-12 DIAGNOSIS — I779 Disorder of arteries and arterioles, unspecified: Secondary | ICD-10-CM

## 2022-04-12 NOTE — Progress Notes (Signed)
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 female here today for follow-up.  I saw her initially as an office visit on 08/02/2020.  She had undergone CT scan for evaluation of pulsation at her sternal notch.  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  She had no evidence of aneurysm.  She did have an incidental finding of a 65% left internal carotid artery stenosis.  She has remained asymptomatic.  She denies amaurosis fugax, aphasia or transient ischemic attack.  Past Medical History:  Diagnosis Date   Anxiety    GERD (gastroesophageal reflux disease)    Hypercholesteremia    Hypertension    Hypothyroidism    Thyroid disease     Family History  Problem Relation Age of Onset   Diabetes Grandson    Colon cancer Neg Hx     SOCIAL HISTORY: Social History   Tobacco Use   Smoking status: Every Day    Packs/day: 0.50    Years: 30.00    Total pack years: 15.00    Types: Cigarettes    Passive exposure: Never   Smokeless tobacco: Never  Substance Use Topics   Alcohol use: No    No Known Allergies  Current Outpatient Medications  Medication Sig Dispense Refill   amLODipine (NORVASC) 5 MG tablet Take 5 mg by mouth daily.     aspirin 325 MG tablet Take 325-650 mg by mouth 2 (two) times daily as needed for moderate pain (takes scheduled at least once daily). For pain     benazepril (LOTENSIN) 40 MG tablet Take 0.5 tablets (20 mg total) by mouth daily. (Needs to be seen before next refill) 15 tablet 0   diphenhydrAMINE (BENADRYL) 25 mg capsule Take 25 mg by mouth at bedtime as needed (for sleep.).      hydrochlorothiazide (MICROZIDE) 12.5 MG capsule Take 12.5 mg by mouth daily.     lactobacillus acidophilus (BACID) TABS tablet Take 2  tablets by mouth 3 (three) times daily.     levothyroxine (SYNTHROID) 100 MCG tablet Take 100 mcg by mouth daily before breakfast.     Melatonin 3 MG TABS Take 3 mg by mouth at bedtime as needed (for sleep.).     No current facility-administered medications for this visit.    REVIEW OF SYSTEMS:  [X]  denotes positive finding, [ ]  denotes negative finding Cardiac  Comments:  Chest pain or chest pressure:    Shortness of breath upon exertion:    Short of breath when lying flat:    Irregular heart rhythm:        Vascular    Pain in calf, thigh, or hip brought on by ambulation:    Pain in feet at night that wakes you up from your sleep:     Blood clot in your veins:    Leg swelling:           PHYSICAL EXAM: Vitals:   04/12/22 1213 04/12/22 1215  BP: (!) 146/82 (!) 161/78  Pulse: 72   Resp: 14   Temp: 98.2 F (36.8 C)   TempSrc: Temporal   SpO2: 95%   Weight: 141 lb 3.2 oz (64 kg)   Height: 5\' 6"  (1.676 m)     GENERAL: The patient is a well-nourished female, in no acute distress. The vital signs  are documented above. CARDIOVASCULAR: Carotid arteries without bruits bilaterally.  Prominent pulsation in the sternal notch nontender. PULMONARY: There is good air exchange  MUSCULOSKELETAL: There are no major deformities or cyanosis. NEUROLOGIC: No focal weakness or paresthesias are detected. SKIN: There are no ulcers or rashes noted. PSYCHIATRIC: The patient has a normal affect.  DATA:  Carotid duplex today reveals no evidence of right carotid stenosis.  Her left carotid is predicted 60 to 79% stenosis.  MEDICAL ISSUES: I reviewed these findings with the patient.  I again reviewed symptoms of carotid disease and she knows to report immediately should this occur.  Otherwise we will see her in 1 year with repeat carotid duplex.  We did discuss the association with cigarette smoking and her carotid disease.  She reports that she had a great deal of stress in her life.    Larina Earthly, MD FACS [facility] [phone]  Note: Portions of this report may have been transcribed using voice recognition software.  Every effort has been made to ensure accuracy; however, inadvertent computerized transcription errors may still be present.

## 2022-05-11 ENCOUNTER — Encounter: Payer: Self-pay | Admitting: General Surgery

## 2022-05-11 ENCOUNTER — Ambulatory Visit: Payer: Medicare Other | Admitting: General Surgery

## 2022-05-11 VITALS — BP 154/77 | HR 59 | Temp 97.8°F | Resp 14 | Ht 66.0 in | Wt 140.0 lb

## 2022-05-11 DIAGNOSIS — K8020 Calculus of gallbladder without cholecystitis without obstruction: Secondary | ICD-10-CM

## 2022-05-11 DIAGNOSIS — K56609 Unspecified intestinal obstruction, unspecified as to partial versus complete obstruction: Secondary | ICD-10-CM | POA: Diagnosis not present

## 2022-05-11 NOTE — Progress Notes (Signed)
[facility] History and Physical  Chief Complaint   Follow-up     DERIN MATTHES is a 76 y.o. female.  HPI: [name] is known to me with a history of gallstones and some abdominal pain that is genralized in nature but can be more located in the RUQ at times after eating. She reports eating a hamburger and milkshake and having pain in the last day in the RUQ. She has nausea and vomiting. She has a diagnosis of IBS and has diarrhea at baseline. She has had a lot of stress with  caring for her husband after his major surgeries and with her family. Vascular surgery saw her and her carotid disease requires no further intervention at this time.   Past Medical History:  Diagnosis Date   Anxiety    GERD (gastroesophageal reflux disease)    Hypercholesteremia    Hypertension    Hypothyroidism    Thyroid disease     Past Surgical History:  Procedure Laterality Date   breast biospy     COLONOSCOPY N/A 10/12/2017   Procedure: COLONOSCOPY;  Surgeon: Malissa Hippo, MD;  Location: [facility];  Service: Endoscopy;  Laterality: N/A;  10:30    Family History  Problem Relation Age of Onset   Diabetes Grandson    Colon cancer Neg Hx     Social History   Tobacco Use   Smoking status: Every Day    Packs/day: 0.50    Years: 30.00    Total pack years: 15.00    Types: Cigarettes    Passive exposure: Never   Smokeless tobacco: Never  Vaping Use   Vaping Use: Never used  Substance Use Topics   Alcohol use: No   Drug use: No    Medications: I have reviewed the patient's current medications. Allergies as of 05/11/2022   No Known Allergies      Medication List        Accurate as of May 11, 2022  3:37 PM. If you have any questions, ask your nurse or doctor.          amLODipine 5 MG tablet Commonly known as: NORVASC Take 5 mg by mouth daily.   aspirin 325 MG tablet Take 325-650 mg by mouth 2 (two) times daily as needed for moderate pain (takes  scheduled at least once daily). For pain   benazepril 40 MG tablet Commonly known as: LOTENSIN Take 0.5 tablets (20 mg total) by mouth daily. (Needs to be seen before next refill)   diphenhydrAMINE 25 mg capsule Commonly known as: BENADRYL Take 25 mg by mouth at bedtime as needed (for sleep.).   hydrochlorothiazide 12.5 MG capsule Commonly known as: MICROZIDE Take 12.5 mg by mouth daily.   lactobacillus acidophilus Tabs tablet Take 2 tablets by mouth 3 (three) times daily.   levothyroxine 100 MCG tablet Commonly known as: SYNTHROID Take 100 mcg by mouth daily before breakfast.   melatonin 3 MG Tabs tablet Take 3 mg by mouth at bedtime as needed (for sleep.).         ROS:  A comprehensive review of systems was negative except for: Gastrointestinal: positive for abdominal pain, diarrhea, and reflux symptoms  Blood pressure (!) 154/77, pulse (!) 59, temperature 97.8 F (36.6 C), temperature source Oral, resp. rate 14, height 5\' 6"  (1.676 m), weight 140 lb (63.5 kg), SpO2 92 %. Physical Exam Vitals reviewed.  Constitutional:      Appearance: Normal appearance.  HENT:  Head: Normocephalic.  Eyes:     Extraocular Movements: Extraocular movements intact.  Cardiovascular:     Rate and Rhythm: Normal rate and regular rhythm.  Pulmonary:     Effort: Pulmonary effort is normal.     Breath sounds: Normal breath sounds.  Abdominal:     General: There is no distension.     Palpations: Abdomen is soft.     Tenderness: There is abdominal tenderness in the right upper quadrant.  Musculoskeletal:        General: Normal range of motion.     Cervical back: Normal range of motion.  Skin:    General: Skin is warm.  Neurological:     General: No focal deficit present.     Mental Status: She is alert. Mental status is at baseline.  Psychiatric:        Mood and Affect: Mood normal.        Thought Content: Thought content normal.        Judgment: Judgment normal.      Results: OSH CT 05/2021 CLINICAL DATA:  Left lower quadrant pain   EXAM:  CT ABDOMEN AND PELVIS WITH CONTRAST   TECHNIQUE:  Multidetector CT imaging of the abdomen and pelvis was performed  using the standard protocol following bolus administration of  intravenous contrast.   CONTRAST:  80 mL Omnipaque 350 intravenous   COMPARISON:  CT 01/31/2015   FINDINGS:  Lower chest: Lung bases demonstrate dependent atelectasis. No acute  consolidation or effusion. Normal cardiac size.   Hepatobiliary: Contracted gallbladder with gallstone. No biliary  dilatation. No focal hepatic abnormality   Pancreas: Unremarkable. No pancreatic ductal dilatation or  surrounding inflammatory changes.   Spleen: Normal in size without focal abnormality.   Adrenals/Urinary Tract: Adrenal glands are normal. Kidneys show no  hydronephrosis. Left parapelvic cysts. Cyst at the lower pole left  kidney. The bladder contains a Foley catheter   Stomach/Bowel: Moderate gastric distension. Contrast reaches the mid  small bowel. Short segment small-bowel intussusception in the right  mid abdomen, series 2, image 38 but no obstruction related to this  finding. Multiple dilated loops of mid to distal small bowel  measuring up to 3.1 cm. Negative appendix. Poorly identified  transition point. No acute bowel wall thickening. Mesenteric  vascular congestion of the dilated small bowel loops. There is fluid  within the colon. Diverticular disease of the sigmoid colon.   Vascular/Lymphatic: Moderate aortic atherosclerosis. No aneurysm. No  suspicious nodes.   Reproductive: Uterus and bilateral adnexa are unremarkable.   Other: Negative for free air. Small amount of free fluid within the  pelvis, and within the bilateral gutters. Small amount of  perihepatic free fluid.   Musculoskeletal: No acute or suspicious osseous abnormality. Procedure Note  Windell Moment, MD - 06/14/2021  Formatting of this  note might be different from the original.  CLINICAL DATA:  Left lower quadrant pain   EXAM:  CT ABDOMEN AND PELVIS WITH CONTRAST   TECHNIQUE:  Multidetector CT imaging of the abdomen and pelvis was performed  using the standard protocol following bolus administration of  intravenous contrast.   CONTRAST:  80 mL Omnipaque 350 intravenous   COMPARISON:  CT 01/31/2015   FINDINGS:  Lower chest: Lung bases demonstrate dependent atelectasis. No acute  consolidation or effusion. Normal cardiac size.   Hepatobiliary: Contracted gallbladder with gallstone. No biliary  dilatation. No focal hepatic abnormality   Pancreas: Unremarkable. No pancreatic ductal dilatation or  surrounding inflammatory changes.  Spleen: Normal in size without focal abnormality.   Adrenals/Urinary Tract: Adrenal glands are normal. Kidneys show no  hydronephrosis. Left parapelvic cysts. Cyst at the lower pole left  kidney. The bladder contains a Foley catheter   Stomach/Bowel: Moderate gastric distension. Contrast reaches the mid  small bowel. Short segment small-bowel intussusception in the right  mid abdomen, series 2, image 38 but no obstruction related to this  finding. Multiple dilated loops of mid to distal small bowel  measuring up to 3.1 cm. Negative appendix. Poorly identified  transition point. No acute bowel wall thickening. Mesenteric  vascular congestion of the dilated small bowel loops. There is fluid  within the colon. Diverticular disease of the sigmoid colon.   Vascular/Lymphatic: Moderate aortic atherosclerosis. No aneurysm. No  suspicious nodes.   Reproductive: Uterus and bilateral adnexa are unremarkable.   Other: Negative for free air. Small amount of free fluid within the  pelvis, and within the bilateral gutters. Small amount of  perihepatic free fluid.   Musculoskeletal: No acute or suspicious osseous abnormality.   IMPRESSION:  1. There are multiple dilated fluid-filled  loops of mid to distal  small bowel within the lower abdomen and pelvis, with associated  mesenteric vascular congestion. Findings are suspicious for bowel  obstruction though transition point is not well identified. There is  no bowel wall thickening or evidence for intramural air at this  time. There is small amount of free fluid in the abdomen and pelvis  but no free air. There is a short segment of small bowel  intussusception in the right mid abdomen but there is no obstruction  related to this finding  2. Diverticular disease of the sigmoid colon without acute wall  thickening  3. Gallstone    Electronically Signed    By: Jasmine Pang M.D.    On: 06/14/2021 22:19   Assessment & Plan:  FRANCEE SETZER is a 76 y.o. female with gallstones. She has some pain at baseline likely from her IBS. She has never had abdominal surgery. She did have a question of a SBO in 05/2021 at an OSH but this was in association with diarrhea so I doubt there is any true obstruction. Discussed cholecystectomy and running her bowel the same time to ensure that everything is good.   PLAN: I counseled the patient about the indication, risks and benefits of laparoscopic cholecystectomy.  She understands there is a very small chance for bleeding, infection, injury to normal structures (including common bile duct), conversion to open surgery, persistent symptoms, evolution of postcholecystectomy diarrhea, need for secondary interventions, anesthesia reaction, cardiopulmonary issues and other risks not specifically detailed here. I described the expected recovery, the plan for follow-up and the restrictions during the recovery phase.  All questions were answered.  Discussed running bowel, injury to bowel, cutting scar or adhesions, bowel resection, needing an open incision.    Discussed that all of her pain is not likely from the gallbladder and her diarrhea could get worse.   All questions were answered to the  satisfaction of the patient.   Lucretia Roers 05/11/2022, 3:37 PM

## 2022-05-11 NOTE — Patient Instructions (Signed)
Minimally Invasive Cholecystectomy Minimally invasive cholecystectomy is surgery to remove the gallbladder. The gallbladder is a pear-shaped organ that lies beneath the liver on the right side of the body. The gallbladder stores bile, which is a fluid that helps the body digest fats. Cholecystectomy is often done to treat inflammation (irritation and swelling) of the gallbladder (cholecystitis). This condition is usually caused by a buildup of gallstones (cholelithiasis) in the gallbladder or when the fluid in the gall bladder becomes stagnant because gallstones get stuck in the ducts (tubes) and block the flow of bile. This can result in inflammation and pain. In severe cases, emergency surgery may be required. This procedure is done through small incisions in the abdomen, instead of one large incision. It is also called laparoscopic surgery. A thin scope with a camera (laparoscope) is inserted through one incision. Then surgical instruments are inserted through the other incisions. In some cases, a minimally invasive surgery may need to be changed to a surgery that is done through a larger incision. This is called open surgery. Tell a health care provider about: Any allergies you have. All medicines you are taking, including vitamins, herbs, eye drops, creams, and over-the-counter medicines. Any problems you or family members have had with anesthetic medicines. Any bleeding problems you have. Any surgeries you have had. Any medical conditions you have. Whether you are pregnant or may be pregnant. What are the risks? Generally, this is a safe procedure. However, problems may occur, including: Infection. Bleeding. Allergic reactions to medicines. Damage to nearby structures or organs. A gallstone remaining in the common bile duct. The common bile duct carries bile from the gallbladder to the small intestine. A bile leak from the liver or cystic duct after your gallbladder is removed. What happens  before the procedure? Medicines Ask your health care provider about: Changing or stopping your regular medicines. This is especially important if you are taking diabetes medicines or blood thinners. Taking medicines such as aspirin and ibuprofen. These medicines can thin your blood. Do not take these medicines unless your health care provider tells you to take them. Taking over-the-counter medicines, vitamins, herbs, and supplements. General instructions If you will be going home right after the procedure, plan to have a responsible adult: Take you home from the hospital or clinic. You will not be allowed to drive. Care for you for the time you are told. Do not use any products that contain nicotine or tobacco for at least 4 weeks before the procedure. These products include cigarettes, chewing tobacco, and vaping devices, such as e-cigarettes. If you need help quitting, ask your health care provider. Ask your health care provider: How your surgery site will be marked. What steps will be taken to help prevent infection. These may include: Removing hair at the surgery site. Washing skin with a germ-killing soap. Taking antibiotic medicine. What happens during the procedure?  An IV will be inserted into one of your veins. You will be given one or both of the following: A medicine to help you relax (sedative). A medicine to make you fall asleep (general anesthetic). Your surgeon will make several small incisions in your abdomen. The laparoscope will be inserted through one of the small incisions. The camera on the laparoscope will send images to a monitor in the operating room. This lets your surgeon see inside your abdomen. A gas will be pumped into your abdomen. This will expand your abdomen to give the surgeon more room to perform the surgery. Other tools that   are needed for the procedure will be inserted through the other incisions. The gallbladder will be removed through one of the  incisions. Your common bile duct may be examined. If stones are found in the common bile duct, they may be removed. After your gallbladder has been removed, the incisions will be closed with stitches (sutures), staples, or skin glue. Your incisions will be covered with a bandage (dressing). The procedure may vary among health care providers and hospitals. What happens after the procedure? Your blood pressure, heart rate, breathing rate, and blood oxygen level will be monitored until you leave the hospital or clinic. You will be given medicines as needed to control your pain. You may have a drain placed in the incision. The drain will be removed a day or two after the procedure. Summary Minimally invasive cholecystectomy, also called laparoscopic cholecystectomy, is surgery to remove the gallbladder using small incisions. Tell your health care provider about all the medical conditions you have and all the medicines you are taking for those conditions. Before the procedure, follow instructions about when to stop eating and drinking and changing or stopping medicines. Plan to have a responsible adult care for you for the time you are told after you leave the hospital or clinic. This information is not intended to replace advice given to you by your health care provider. Make sure you discuss any questions you have with your health care provider. Document Revised: 03/08/2021 Document Reviewed: 03/08/2021 [employer] Patient Education  2023 [employer]  

## 2022-05-12 DIAGNOSIS — K56609 Unspecified intestinal obstruction, unspecified as to partial versus complete obstruction: Secondary | ICD-10-CM

## 2022-05-12 NOTE — H&P (Signed)
[facility] History and Physical  Chief Complaint   Follow-up     Erin Watkins is a 76 y.o. female.  HPI: Erin Watkins is known to me with a history of gallstones and some abdominal pain that is genralized in nature but can be more located in the RUQ at times after eating. She reports eating a hamburger and milkshake and having pain in the last day in the RUQ. She has nausea and vomiting. She has a diagnosis of IBS and has diarrhea at baseline. She has had a lot of stress with  caring for her husband after his major surgeries and with her family. Vascular surgery saw her and her carotid disease requires no further intervention at this time.   Past Medical History:  Diagnosis Date   Anxiety    GERD (gastroesophageal reflux disease)    Hypercholesteremia    Hypertension    Hypothyroidism    Thyroid disease     Past Surgical History:  Procedure Laterality Date   breast biospy     COLONOSCOPY N/A 10/12/2017   Procedure: COLONOSCOPY;  Surgeon: Malissa Hippo, MD;  Location: [facility];  Service: Endoscopy;  Laterality: N/A;  10:30    Family History  Problem Relation Age of Onset   Diabetes Grandson    Colon cancer Neg Hx     Social History   Tobacco Use   Smoking status: Every Day    Packs/day: 0.50    Years: 30.00    Total pack years: 15.00    Types: Cigarettes    Passive exposure: Never   Smokeless tobacco: Never  Vaping Use   Vaping Use: Never used  Substance Use Topics   Alcohol use: No   Drug use: No    Medications: I have reviewed the patient's current medications. Allergies as of 05/11/2022   No Known Allergies      Medication List        Accurate as of May 11, 2022  3:37 PM. If you have any questions, ask your nurse or doctor.          amLODipine 5 MG tablet Commonly known as: NORVASC Take 5 mg by mouth daily.   aspirin 325 MG tablet Take 325-650 mg by mouth 2 (two) times daily as needed for moderate pain (takes  scheduled at least once daily). For pain   benazepril 40 MG tablet Commonly known as: LOTENSIN Take 0.5 tablets (20 mg total) by mouth daily. (Needs to be seen before next refill)   diphenhydrAMINE 25 mg capsule Commonly known as: BENADRYL Take 25 mg by mouth at bedtime as needed (for sleep.).   hydrochlorothiazide 12.5 MG capsule Commonly known as: MICROZIDE Take 12.5 mg by mouth daily.   lactobacillus acidophilus Tabs tablet Take 2 tablets by mouth 3 (three) times daily.   levothyroxine 100 MCG tablet Commonly known as: SYNTHROID Take 100 mcg by mouth daily before breakfast.   melatonin 3 MG Tabs tablet Take 3 mg by mouth at bedtime as needed (for sleep.).         ROS:  A comprehensive review of systems was negative except for: Gastrointestinal: positive for abdominal pain, diarrhea, and reflux symptoms  Blood pressure (!) 154/77, pulse (!) 59, temperature 97.8 F (36.6 C), temperature source Oral, resp. rate 14, height 5\' 6"  (1.676 m), weight 140 lb (63.5 kg), SpO2 92 %. Physical Exam Vitals reviewed.  Constitutional:      Appearance: Normal appearance.  HENT:  Head: Normocephalic.  Eyes:     Extraocular Movements: Extraocular movements intact.  Cardiovascular:     Rate and Rhythm: Normal rate and regular rhythm.  Pulmonary:     Effort: Pulmonary effort is normal.     Breath sounds: Normal breath sounds.  Abdominal:     General: There is no distension.     Palpations: Abdomen is soft.     Tenderness: There is abdominal tenderness in the right upper quadrant.  Musculoskeletal:        General: Normal range of motion.     Cervical back: Normal range of motion.  Skin:    General: Skin is warm.  Neurological:     General: No focal deficit present.     Mental Status: She is alert. Mental status is at baseline.  Psychiatric:        Mood and Affect: Mood normal.        Thought Content: Thought content normal.        Judgment: Judgment normal.      Results: OSH CT 05/2021 CLINICAL DATA:  Left lower quadrant pain   EXAM:  CT ABDOMEN AND PELVIS WITH CONTRAST   TECHNIQUE:  Multidetector CT imaging of the abdomen and pelvis was performed  using the standard protocol following bolus administration of  intravenous contrast.   CONTRAST:  80 mL Omnipaque 350 intravenous   COMPARISON:  CT 01/31/2015   FINDINGS:  Lower chest: Lung bases demonstrate dependent atelectasis. No acute  consolidation or effusion. Normal cardiac size.   Hepatobiliary: Contracted gallbladder with gallstone. No biliary  dilatation. No focal hepatic abnormality   Pancreas: Unremarkable. No pancreatic ductal dilatation or  surrounding inflammatory changes.   Spleen: Normal in size without focal abnormality.   Adrenals/Urinary Tract: Adrenal glands are normal. Kidneys show no  hydronephrosis. Left parapelvic cysts. Cyst at the lower pole left  kidney. The bladder contains a Foley catheter   Stomach/Bowel: Moderate gastric distension. Contrast reaches the mid  small bowel. Short segment small-bowel intussusception in the right  mid abdomen, series 2, image 38 but no obstruction related to this  finding. Multiple dilated loops of mid to distal small bowel  measuring up to 3.1 cm. Negative appendix. Poorly identified  transition point. No acute bowel wall thickening. Mesenteric  vascular congestion of the dilated small bowel loops. There is fluid  within the colon. Diverticular disease of the sigmoid colon.   Vascular/Lymphatic: Moderate aortic atherosclerosis. No aneurysm. No  suspicious nodes.   Reproductive: Uterus and bilateral adnexa are unremarkable.   Other: Negative for free air. Small amount of free fluid within the  pelvis, and within the bilateral gutters. Small amount of  perihepatic free fluid.   Musculoskeletal: No acute or suspicious osseous abnormality. Procedure Note  Fujinaga, Kim Mika, MD - 06/14/2021  Formatting of this  note might be different from the original.  CLINICAL DATA:  Left lower quadrant pain   EXAM:  CT ABDOMEN AND PELVIS WITH CONTRAST   TECHNIQUE:  Multidetector CT imaging of the abdomen and pelvis was performed  using the standard protocol following bolus administration of  intravenous contrast.   CONTRAST:  80 mL Omnipaque 350 intravenous   COMPARISON:  CT 01/31/2015   FINDINGS:  Lower chest: Lung bases demonstrate dependent atelectasis. No acute  consolidation or effusion. Normal cardiac size.   Hepatobiliary: Contracted gallbladder with gallstone. No biliary  dilatation. No focal hepatic abnormality   Pancreas: Unremarkable. No pancreatic ductal dilatation or  surrounding inflammatory changes.     Spleen: Normal in size without focal abnormality.   Adrenals/Urinary Tract: Adrenal glands are normal. Kidneys show no  hydronephrosis. Left parapelvic cysts. Cyst at the lower pole left  kidney. The bladder contains a Foley catheter   Stomach/Bowel: Moderate gastric distension. Contrast reaches the mid  small bowel. Short segment small-bowel intussusception in the right  mid abdomen, series 2, image 38 but no obstruction related to this  finding. Multiple dilated loops of mid to distal small bowel  measuring up to 3.1 cm. Negative appendix. Poorly identified  transition point. No acute bowel wall thickening. Mesenteric  vascular congestion of the dilated small bowel loops. There is fluid  within the colon. Diverticular disease of the sigmoid colon.   Vascular/Lymphatic: Moderate aortic atherosclerosis. No aneurysm. No  suspicious nodes.   Reproductive: Uterus and bilateral adnexa are unremarkable.   Other: Negative for free air. Small amount of free fluid within the  pelvis, and within the bilateral gutters. Small amount of  perihepatic free fluid.   Musculoskeletal: No acute or suspicious osseous abnormality.   IMPRESSION:  1. There are multiple dilated fluid-filled  loops of mid to distal  small bowel within the lower abdomen and pelvis, with associated  mesenteric vascular congestion. Findings are suspicious for bowel  obstruction though transition point is not well identified. There is  no bowel wall thickening or evidence for intramural air at this  time. There is small amount of free fluid in the abdomen and pelvis  but no free air. There is a short segment of small bowel  intussusception in the right mid abdomen but there is no obstruction  related to this finding  2. Diverticular disease of the sigmoid colon without acute wall  thickening  3. Gallstone    Electronically Signed    By: Kim  Fujinaga M.D.    On: 06/14/2021 22:19   Assessment & Plan:  Sofija T Daughdrill is a 75 y.o. female with gallstones. She has some pain at baseline likely from her IBS. She has never had abdominal surgery. She did have a question of a SBO in 05/2021 at an OSH but this was in association with diarrhea so I doubt there is any true obstruction. Discussed cholecystectomy and running her bowel the same time to ensure that everything is good.   PLAN: I counseled the patient about the indication, risks and benefits of laparoscopic cholecystectomy.  She understands there is a very small chance for bleeding, infection, injury to normal structures (including common bile duct), conversion to open surgery, persistent symptoms, evolution of postcholecystectomy diarrhea, need for secondary interventions, anesthesia reaction, cardiopulmonary issues and other risks not specifically detailed here. I described the expected recovery, the plan for follow-up and the restrictions during the recovery phase.  All questions were answered.  Discussed running bowel, injury to bowel, cutting scar or adhesions, bowel resection, needing an open incision.    Discussed that all of her pain is not likely from the gallbladder and her diarrhea could get worse.   All questions were answered to the  satisfaction of the patient.   Robel Wuertz C Blaike Newburn 05/11/2022, 3:37 PM       

## 2022-05-15 NOTE — Patient Instructions (Signed)
Erin Watkins  05/15/2022     @PREFPERIOPPHARMACY @   Your procedure is scheduled on  05/18/2022.   Report to 05/20/2022 at  0745  A.M.   Call this number if you have problems the morning of surgery:  [phone]   Remember:  Do not eat or drink after midnight.     Take these medicines the morning of surgery with A SIP OF WATER                            norvasc, synthroid, prilosec.     Do not wear jewelry, make-up or nail polish.  Do not wear lotions, powders, or perfumes, or deodorant.  Do not shave 48 hours prior to surgery.  Men may shave face and neck.  Do not bring valuables to the hospital.  [facility] is not responsible for any belongings or valuables.  Contacts, dentures or bridgework may not be worn into surgery.  Leave your suitcase in the car.  After surgery it may be brought to your room.  For patients admitted to the hospital, discharge time will be determined by your treatment team.  Patients discharged the day of surgery will not be allowed to drive home and must have someone with them for 24 hours.     Special instructions:   DO NOT smoke tobacco or vape for 24 hours before your procedure.  Please read over the following fact sheets that you were given. Coughing and Deep Breathing, Surgical Site Infection Prevention, Anesthesia Post-op Instructions, and Care and Recovery After Surgery        Diagnostic Laparoscopy, Care After The following information offers guidance on how to care for yourself after your procedure. Your health care provider may also give you more specific instructions. If you have problems or questions, contact your health care provider. What can I expect after the procedure? After the procedure, it is common to have: Mild discomfort in the abdomen. Sore throat. Women who have laparoscopy with a pelvic examination may have mild cramping and fluid coming from the vagina for a few days after the procedure. Follow  these instructions at home: Medicines Take over-the-counter and prescription medicines only as told by your health care provider. If you were prescribed an antibiotic medicine, take it as told by your health care provider. Do not stop taking the antibiotic even if you start to feel better. Ask your health care provider if the medicine prescribed to you: Requires you to avoid driving or using machinery. Can cause constipation. You may need to take these actions to prevent or treat constipation: Drink enough fluid to keep your urine pale yellow. Take over-the-counter or prescription medicines. Eat foods that are high in fiber, such as beans, whole grains, and fresh fruits and vegetables. Limit foods that are high in fat and processed sugars, such as fried or sweet foods. Incision care  Follow instructions from your health care provider about how to take care of your incisions. Make sure you: Wash your hands with soap and water for at least 20 seconds before and after you change your bandage (dressing). If soap and water are not available, use hand sanitizer. Change your dressing as told by your health care provider. Leave stitches (sutures), skin glue, or surgical tape in place. These skin closures may need to stay in place for 2 weeks or longer. If surgical tape edges start to loosen and  curl up, you may trim the loose edges. Do not remove the surgical tape completely unless your health care provider tells you to do that. Check your incision areas every day for signs of infection. Check for: Redness, swelling, or pain. Fluid or blood. Warmth. Pus or a bad smell. Activity Return to your normal activities as told by your health care provider. Ask your health care provider what activities are safe for you. Do not lift anything that is heavier than 10 lb (4.5 kg), or the limit that you are told, until your health care provider says that it is safe. Avoid sitting for a long time without moving.  Get up to take short walks every 1-2 hours. This is important to improve blood flow and breathing. Ask for help if you feel weak or unsteady. General instructions Do not use any products that contain nicotine or tobacco. These products include cigarettes, chewing tobacco, and vaping devices, such as e-cigarettes. If you need help quitting, ask your health care provider. If you were given a sedative during the procedure, it can affect you for several hours. Do not drive or operate machinery until your health care provider says that it is safe. Do not take baths, swim, or use a hot tub until your health care provider approves. Ask your health care provider if you may take showers. You may only be allowed to take sponge baths. Keep all follow-up visits. This is important. Contact a health care provider if: You develop shoulder pain. You feel light-headed or faint. You are unable to pass gas or have a bowel movement. You feel nauseous or you vomit. You develop a rash. You have any of these signs of infection: Redness, swelling, or pain around an incision. Fluid or blood coming from an incision. Warmth coming from an incision. Pus or a bad smell coming from an incision. A fever or chills. Get help right away if: You have severe pain. You have vomiting that does not go away. You have heavy bleeding from the vagina. Any incision opens up. You have trouble breathing. You have chest pain. These symptoms may represent a serious problem that is an emergency. Do not wait to see if the symptoms will go away. Get medical help right away. Call your local emergency services (911 in the [place] Do not drive yourself to the hospital. Summary After the procedure, it is common to have mild discomfort in the abdomen and a sore throat. Check your incision areas every day for signs of infection. Return to your normal activities as told by your health care provider. Ask your health care provider what activities  are safe for you. This information is not intended to replace advice given to you by your health care provider. Make sure you discuss any questions you have with your health care provider. Document Revised: 04/30/2020 Document Reviewed: 04/30/2020 Elsevier Patient Education  2023 [employer] Minimally Invasive Cholecystectomy, Care After The following information offers guidance on how to care for yourself after your procedure. Your health care provider may also give you more specific instructions. If you have problems or questions, contact your health care provider. What can I expect after the procedure? After the procedure, it is common to have: Pain at your incision sites. You will be given medicines to control this pain. Mild nausea or vomiting. Bloating and possible shoulder pain from the gas that was used during the procedure. Follow these instructions at home: Medicines Take over-the-counter and prescription medicines only as told by your  health care provider. If you were prescribed an antibiotic medicine, take it as told by your health care provider. Do not stop using the antibiotic even if you start to feel better. Ask your health care provider if the medicine prescribed to you: Requires you to avoid driving or using machinery. Can cause constipation. You may need to take these actions to prevent or treat constipation: Drink enough fluid to keep your urine pale yellow. Take over-the-counter or prescription medicines. Eat foods that are high in fiber, such as beans, whole grains, and fresh fruits and vegetables. Limit foods that are high in fat and processed sugars, such as fried or sweet foods. Incision care  Follow instructions from your health care provider about how to take care of your incisions. Make sure you: Wash your hands with soap and water for at least 20 seconds before and after you change your bandage (dressing). If soap and water are not available, use hand  sanitizer. Change your dressing as told by your health care provider. Leave stitches (sutures), skin glue, or adhesive strips in place. These skin closures may need to be in place for 2 weeks or longer. If adhesive strip edges start to loosen and curl up, you may trim the loose edges. Do not remove adhesive strips completely unless your health care provider tells you to do that. Do not take baths, swim, or use a hot tub until your health care provider approves. Ask your health care provider if you may take showers. You may only be allowed to take sponge baths. Check your incision area every day for signs of infection. Check for: More redness, swelling, or pain. Fluid or blood. Warmth. Pus or a bad smell. Activity Rest as told by your health care provider. Do not do activities that require a lot of effort. Avoid sitting for a long time without moving. Get up to take short walks every 1-2 hours. This is important to improve blood flow and breathing. Ask for help if you feel weak or unsteady. Do not lift anything that is heavier than 10 lb (4.5 kg), or the limit that you are told, until your health care provider says that it is safe. Do not play contact sports until your health care provider approves. Do not return to work or school until your health care provider approves. Return to your normal activities as told by your health care provider. Ask your health care provider what activities are safe for you. General instructions If you were given a sedative during the procedure, it can affect you for several hours. Do not drive or operate machinery until your health care provider says that it is safe. Keep all follow-up visits. This is important. Contact a health care provider if: You develop a rash. You have more redness, swelling, or pain around your incisions. You have fluid or blood coming from your incisions. Your incisions feel warm to the touch. You have pus or a bad smell coming from your  incisions. You have a fever. One or more of your incisions breaks open. Get help right away if: You have trouble breathing. You have chest pain. You have more pain in your shoulders. You faint or feel dizzy when you stand. You have severe pain in your abdomen. You have nausea or vomiting that lasts for more than one day. You have leg pain that is new or unusual, or if it is localized to one specific spot. These symptoms may represent a serious problem that is an emergency. Do  not wait to see if the symptoms will go away. Get medical help right away. Call your local emergency services (911 in the [place] Do not drive yourself to the hospital. Summary After your procedure, it is common to have pain at the incision sites. You may also have nausea or bloating. Follow your health care provider's instructions about medicine, activity restrictions, and caring for your incision areas. Do not do activities that require a lot of effort. Contact a health care provider if you have a fever or other signs of infection, such as more redness, swelling, or pain around the incisions. Get help right away if you have chest pain, increasing pain in the shoulders, or trouble breathing. This information is not intended to replace advice given to you by your health care provider. Make sure you discuss any questions you have with your health care provider. Document Revised: 03/08/2021 Document Reviewed: 03/08/2021 Elsevier Patient Education  2023 [employer] General Anesthesia, Adult, Care After The following information offers guidance on how to care for yourself after your procedure. Your health care provider may also give you more specific instructions. If you have problems or questions, contact your health care provider. What can I expect after the procedure? After the procedure, it is common for people to: Have pain or discomfort at the IV site. Have nausea or vomiting. Have a sore throat or  hoarseness. Have trouble concentrating. Feel cold or chills. Feel weak, sleepy, or tired (fatigue). Have soreness and body aches. These can affect parts of the body that were not involved in surgery. Follow these instructions at home: For the time period you were told by your health care provider:  Rest. Do not participate in activities where you could fall or become injured. Do not drive or use machinery. Do not drink alcohol. Do not take sleeping pills or medicines that cause drowsiness. Do not make important decisions or sign legal documents. Do not take care of children on your own. General instructions Drink enough fluid to keep your urine pale yellow. If you have sleep apnea, surgery and certain medicines can increase your risk for breathing problems. Follow instructions from your health care provider about wearing your sleep device: Anytime you are sleeping, including during daytime naps. While taking prescription pain medicines, sleeping medicines, or medicines that make you drowsy. Return to your normal activities as told by your health care provider. Ask your health care provider what activities are safe for you. Take over-the-counter and prescription medicines only as told by your health care provider. Do not use any products that contain nicotine or tobacco. These products include cigarettes, chewing tobacco, and vaping devices, such as e-cigarettes. These can delay incision healing after surgery. If you need help quitting, ask your health care provider. Contact a health care provider if: You have nausea or vomiting that does not get better with medicine. You vomit every time you eat or drink. You have pain that does not get better with medicine. You cannot urinate or have bloody urine. You develop a skin rash. You have a fever. Get help right away if: You have trouble breathing. You have chest pain. You vomit blood. These symptoms may be an emergency. Get help right  away. Call 911. Do not wait to see if the symptoms will go away. Do not drive yourself to the hospital. Summary After the procedure, it is common to have a sore throat, hoarseness, nausea, vomiting, or to feel weak, sleepy, or fatigue. For the time period you were  told by your health care provider, do not drive or use machinery. Get help right away if you have difficulty breathing, have chest pain, or vomit blood. These symptoms may be an emergency. This information is not intended to replace advice given to you by your health care provider. Make sure you discuss any questions you have with your health care provider. Document Revised: 12/02/2021 Document Reviewed: 12/02/2021 Elsevier Patient Education  2023 [employer] Inc. How to Use Chlorhexidine Before Surgery Chlorhexidine gluconate (CHG) is a germ-killing (antiseptic) solution that is used to clean the skin. It can get rid of the bacteria that normally live on the skin and can keep them away for about 24 hours. To clean your skin with CHG, you may be given: A CHG solution to use in the shower or as part of a sponge bath. A prepackaged cloth that contains CHG. Cleaning your skin with CHG may help lower the risk for infection: While you are staying in the intensive care unit of the hospital. If you have a vascular access, such as a central line, to provide short-term or long-term access to your veins. If you have a catheter to drain urine from your bladder. If you are on a ventilator. A ventilator is a machine that helps you breathe by moving air in and out of your lungs. After surgery. What are the risks? Risks of using CHG include: A skin reaction. Hearing loss, if CHG gets in your ears and you have a perforated eardrum. Eye injury, if CHG gets in your eyes and is not rinsed out. The CHG product catching fire. Make sure that you avoid smoking and flames after applying CHG to your skin. Do not use CHG: If you have a chlorhexidine  allergy or have previously reacted to chlorhexidine. On babies younger than 22 months of age. How to use CHG solution Use CHG only as told by your health care provider, and follow the instructions on the label. Use the full amount of CHG as directed. Usually, this is one bottle. During a shower Follow these steps when using CHG solution during a shower (unless your health care provider gives you different instructions): Start the shower. Use your normal soap and shampoo to wash your face and hair. Turn off the shower or move out of the shower stream. Pour the CHG onto a clean washcloth. Do not use any type of brush or rough-edged sponge. Starting at your neck, lather your body down to your toes. Make sure you follow these instructions: If you will be having surgery, pay special attention to the part of your body where you will be having surgery. Scrub this area for at least 1 minute. Do not use CHG on your head or face. If the solution gets into your ears or eyes, rinse them well with water. Avoid your genital area. Avoid any areas of skin that have broken skin, cuts, or scrapes. Scrub your back and under your arms. Make sure to wash skin folds. Let the lather sit on your skin for 1-2 minutes or as long as told by your health care provider. Thoroughly rinse your entire body in the shower. Make sure that all body creases and crevices are rinsed well. Dry off with a clean towel. Do not put any substances on your body afterward--such as powder, lotion, or perfume--unless you are told to do so by your health care provider. Only use lotions that are recommended by the manufacturer. Put on clean clothes or pajamas. If it is the  night before your surgery, sleep in clean sheets.  During a sponge bath Follow these steps when using CHG solution during a sponge bath (unless your health care provider gives you different instructions): Use your normal soap and shampoo to wash your face and hair. Pour the  CHG onto a clean washcloth. Starting at your neck, lather your body down to your toes. Make sure you follow these instructions: If you will be having surgery, pay special attention to the part of your body where you will be having surgery. Scrub this area for at least 1 minute. Do not use CHG on your head or face. If the solution gets into your ears or eyes, rinse them well with water. Avoid your genital area. Avoid any areas of skin that have broken skin, cuts, or scrapes. Scrub your back and under your arms. Make sure to wash skin folds. Let the lather sit on your skin for 1-2 minutes or as long as told by your health care provider. Using a different clean, wet washcloth, thoroughly rinse your entire body. Make sure that all body creases and crevices are rinsed well. Dry off with a clean towel. Do not put any substances on your body afterward--such as powder, lotion, or perfume--unless you are told to do so by your health care provider. Only use lotions that are recommended by the manufacturer. Put on clean clothes or pajamas. If it is the night before your surgery, sleep in clean sheets. How to use CHG prepackaged cloths Only use CHG cloths as told by your health care provider, and follow the instructions on the label. Use the CHG cloth on clean, dry skin. Do not use the CHG cloth on your head or face unless your health care provider tells you to. When washing with the CHG cloth: Avoid your genital area. Avoid any areas of skin that have broken skin, cuts, or scrapes. Before surgery Follow these steps when using a CHG cloth to clean before surgery (unless your health care provider gives you different instructions): Using the CHG cloth, vigorously scrub the part of your body where you will be having surgery. Scrub using a back-and-forth motion for 3 minutes. The area on your body should be completely wet with CHG when you are done scrubbing. Do not rinse. Discard the cloth and let the area  air-dry. Do not put any substances on the area afterward, such as powder, lotion, or perfume. Put on clean clothes or pajamas. If it is the night before your surgery, sleep in clean sheets.  For general bathing Follow these steps when using CHG cloths for general bathing (unless your health care provider gives you different instructions). Use a separate CHG cloth for each area of your body. Make sure you wash between any folds of skin and between your fingers and toes. Wash your body in the following order, switching to a new cloth after each step: The front of your neck, shoulders, and chest. Both of your arms, under your arms, and your hands. Your stomach and groin area, avoiding the genitals. Your right leg and foot. Your left leg and foot. The back of your neck, your back, and your buttocks. Do not rinse. Discard the cloth and let the area air-dry. Do not put any substances on your body afterward--such as powder, lotion, or perfume--unless you are told to do so by your health care provider. Only use lotions that are recommended by the manufacturer. Put on clean clothes or pajamas. Contact a health care provider if:  Your skin gets irritated after scrubbing. You have questions about using your solution or cloth. You swallow any chlorhexidine. Call your local poison control center ([phone] in the [place] Get help right away if: Your eyes itch badly, or they become very red or swollen. Your skin itches badly and is red or swollen. Your hearing changes. You have trouble seeing. You have swelling or tingling in your mouth or throat. You have trouble breathing. These symptoms may represent a serious problem that is an emergency. Do not wait to see if the symptoms will go away. Get medical help right away. Call your local emergency services (911 in the [place] Do not drive yourself to the hospital. Summary Chlorhexidine gluconate (CHG) is a germ-killing (antiseptic) solution that is used  to clean the skin. Cleaning your skin with CHG may help to lower your risk for infection. You may be given CHG to use for bathing. It may be in a bottle or in a prepackaged cloth to use on your skin. Carefully follow your health care provider's instructions and the instructions on the product label. Do not use CHG if you have a chlorhexidine allergy. Contact your health care provider if your skin gets irritated after scrubbing. This information is not intended to replace advice given to you by your health care provider. Make sure you discuss any questions you have with your health care provider. Document Revised: 01/02/2022 Document Reviewed: 11/15/2020 Elsevier Patient Education  2023 [employer].

## 2022-05-16 ENCOUNTER — Encounter (HOSPITAL_COMMUNITY): Payer: Self-pay

## 2022-05-16 ENCOUNTER — Other Ambulatory Visit: Payer: Self-pay

## 2022-05-16 ENCOUNTER — Encounter (HOSPITAL_COMMUNITY)
Admission: RE | Admit: 2022-05-16 | Discharge: 2022-05-16 | Disposition: A | Discharge status: Home / Self Care | Payer: Medicare Other | Source: Ambulatory Visit | Attending: General Surgery | Admitting: General Surgery

## 2022-05-16 VITALS — BP 153/71 | HR 66 | Temp 97.8°F | Resp 18 | Ht 66.0 in | Wt 140.0 lb

## 2022-05-16 DIAGNOSIS — K219 Gastro-esophageal reflux disease without esophagitis: Secondary | ICD-10-CM | POA: Diagnosis not present

## 2022-05-16 DIAGNOSIS — F17210 Nicotine dependence, cigarettes, uncomplicated: Secondary | ICD-10-CM | POA: Diagnosis not present

## 2022-05-16 DIAGNOSIS — Z5331 Laparoscopic surgical procedure converted to open procedure: Secondary | ICD-10-CM | POA: Diagnosis not present

## 2022-05-16 DIAGNOSIS — K811 Chronic cholecystitis: Secondary | ICD-10-CM | POA: Diagnosis not present

## 2022-05-16 DIAGNOSIS — K660 Peritoneal adhesions (postprocedural) (postinfection): Secondary | ICD-10-CM | POA: Diagnosis not present

## 2022-05-16 DIAGNOSIS — K8010 Calculus of gallbladder with chronic cholecystitis without obstruction: Secondary | ICD-10-CM | POA: Diagnosis not present

## 2022-05-16 DIAGNOSIS — E7800 Pure hypercholesterolemia, unspecified: Secondary | ICD-10-CM | POA: Diagnosis not present

## 2022-05-16 DIAGNOSIS — K589 Irritable bowel syndrome without diarrhea: Secondary | ICD-10-CM | POA: Diagnosis not present

## 2022-05-16 DIAGNOSIS — Z79899 Other long term (current) drug therapy: Secondary | ICD-10-CM | POA: Diagnosis not present

## 2022-05-16 DIAGNOSIS — T380X5A Adverse effect of glucocorticoids and synthetic analogues, initial encounter: Secondary | ICD-10-CM | POA: Diagnosis not present

## 2022-05-16 DIAGNOSIS — K821 Hydrops of gallbladder: Secondary | ICD-10-CM | POA: Diagnosis not present

## 2022-05-16 DIAGNOSIS — F419 Anxiety disorder, unspecified: Secondary | ICD-10-CM | POA: Diagnosis present

## 2022-05-16 DIAGNOSIS — E039 Hypothyroidism, unspecified: Secondary | ICD-10-CM | POA: Diagnosis not present

## 2022-05-16 DIAGNOSIS — Z01810 Encounter for preprocedural cardiovascular examination: Secondary | ICD-10-CM | POA: Insufficient documentation

## 2022-05-16 DIAGNOSIS — K469 Unspecified abdominal hernia without obstruction or gangrene: Secondary | ICD-10-CM | POA: Diagnosis not present

## 2022-05-16 DIAGNOSIS — I10 Essential (primary) hypertension: Secondary | ICD-10-CM | POA: Insufficient documentation

## 2022-05-16 DIAGNOSIS — Z79890 Hormone replacement therapy (postmenopausal): Secondary | ICD-10-CM | POA: Diagnosis not present

## 2022-05-16 DIAGNOSIS — K458 Other specified abdominal hernia without obstruction or gangrene: Secondary | ICD-10-CM | POA: Diagnosis not present

## 2022-05-18 ENCOUNTER — Ambulatory Visit (HOSPITAL_COMMUNITY): Payer: Medicare Other | Admitting: Certified Registered"

## 2022-05-18 ENCOUNTER — Inpatient Hospital Stay (HOSPITAL_COMMUNITY)
Admission: AD | Admit: 2022-05-18 | Discharge: 2022-05-19 | DRG: 415 | Disposition: A | Discharge status: Home / Self Care | Payer: Medicare Other | Source: Ambulatory Visit | Attending: General Surgery | Admitting: General Surgery

## 2022-05-18 ENCOUNTER — Other Ambulatory Visit: Payer: Self-pay

## 2022-05-18 ENCOUNTER — Encounter (HOSPITAL_COMMUNITY): Payer: Self-pay | Admitting: General Surgery

## 2022-05-18 ENCOUNTER — Encounter (HOSPITAL_COMMUNITY)
Admission: AD | Disposition: A | Discharge status: Home / Self Care | Payer: Self-pay | Source: Ambulatory Visit | Attending: General Surgery

## 2022-05-18 DIAGNOSIS — Z5331 Laparoscopic surgical procedure converted to open procedure: Secondary | ICD-10-CM

## 2022-05-18 DIAGNOSIS — K219 Gastro-esophageal reflux disease without esophagitis: Secondary | ICD-10-CM | POA: Diagnosis present

## 2022-05-18 DIAGNOSIS — Z79890 Hormone replacement therapy (postmenopausal): Secondary | ICD-10-CM

## 2022-05-18 DIAGNOSIS — K589 Irritable bowel syndrome without diarrhea: Secondary | ICD-10-CM | POA: Diagnosis present

## 2022-05-18 DIAGNOSIS — I10 Essential (primary) hypertension: Secondary | ICD-10-CM | POA: Diagnosis present

## 2022-05-18 DIAGNOSIS — E039 Hypothyroidism, unspecified: Secondary | ICD-10-CM | POA: Diagnosis present

## 2022-05-18 DIAGNOSIS — K811 Chronic cholecystitis: Secondary | ICD-10-CM

## 2022-05-18 DIAGNOSIS — Z79899 Other long term (current) drug therapy: Secondary | ICD-10-CM | POA: Diagnosis not present

## 2022-05-18 DIAGNOSIS — K660 Peritoneal adhesions (postprocedural) (postinfection): Secondary | ICD-10-CM | POA: Diagnosis present

## 2022-05-18 DIAGNOSIS — K469 Unspecified abdominal hernia without obstruction or gangrene: Secondary | ICD-10-CM

## 2022-05-18 DIAGNOSIS — E7800 Pure hypercholesterolemia, unspecified: Secondary | ICD-10-CM | POA: Diagnosis present

## 2022-05-18 DIAGNOSIS — K56609 Unspecified intestinal obstruction, unspecified as to partial versus complete obstruction: Secondary | ICD-10-CM

## 2022-05-18 DIAGNOSIS — K458 Other specified abdominal hernia without obstruction or gangrene: Secondary | ICD-10-CM | POA: Diagnosis present

## 2022-05-18 DIAGNOSIS — K821 Hydrops of gallbladder: Secondary | ICD-10-CM | POA: Diagnosis not present

## 2022-05-18 DIAGNOSIS — F419 Anxiety disorder, unspecified: Secondary | ICD-10-CM | POA: Diagnosis present

## 2022-05-18 DIAGNOSIS — K8010 Calculus of gallbladder with chronic cholecystitis without obstruction: Principal | ICD-10-CM | POA: Diagnosis present

## 2022-05-18 DIAGNOSIS — T380X5A Adverse effect of glucocorticoids and synthetic analogues, initial encounter: Secondary | ICD-10-CM | POA: Diagnosis not present

## 2022-05-18 DIAGNOSIS — K8020 Calculus of gallbladder without cholecystitis without obstruction: Principal | ICD-10-CM

## 2022-05-18 DIAGNOSIS — F17210 Nicotine dependence, cigarettes, uncomplicated: Secondary | ICD-10-CM | POA: Diagnosis present

## 2022-05-18 HISTORY — PX: CHOLECYSTECTOMY: SHX55

## 2022-05-18 HISTORY — PX: LAPAROSCOPY: SHX197

## 2022-05-18 SURGERY — LAPAROSCOPIC CHOLECYSTECTOMY
Anesthesia: General

## 2022-05-18 MED ORDER — 0.9 % SODIUM CHLORIDE (POUR BTL) OPTIME
TOPICAL | Status: DC | PRN
  Administered 2022-05-18: 1000 mL

## 2022-05-18 MED ORDER — ONDANSETRON HCL 4 MG/2ML IJ SOLN
INTRAMUSCULAR | Status: DC | PRN
  Administered 2022-05-18 (×2): 4 mg via INTRAVENOUS

## 2022-05-18 MED ORDER — FENTANYL CITRATE (PF) 250 MCG/5ML IJ SOLN
INTRAMUSCULAR | Status: AC
  Filled 2022-05-18: qty 5

## 2022-05-18 MED ORDER — ONDANSETRON HCL 4 MG/2ML IJ SOLN: 4.0000 mg | Freq: Four times a day (QID) | INTRAMUSCULAR | Status: DC | PRN

## 2022-05-18 MED ORDER — GLYCOPYRROLATE 0.2 MG/ML IJ SOLN
INTRAMUSCULAR | Status: DC | PRN
  Administered 2022-05-18 (×2): .1 mg via INTRAVENOUS

## 2022-05-18 MED ORDER — METHOCARBAMOL 500 MG PO TABS
500.0000 mg | ORAL_TABLET | Freq: Three times a day (TID) | ORAL | Status: DC | PRN
  Administered 2022-05-18 – 2022-05-19 (×2): 500 mg via ORAL
  Filled 2022-05-18 (×2): qty 1

## 2022-05-18 MED ORDER — ONDANSETRON 4 MG PO TBDP: 4.0000 mg | ORAL_TABLET | Freq: Four times a day (QID) | ORAL | Status: DC | PRN

## 2022-05-18 MED ORDER — MIDAZOLAM HCL 2 MG/2ML IJ SOLN
INTRAMUSCULAR | Status: AC
  Filled 2022-05-18: qty 2

## 2022-05-18 MED ORDER — SIMETHICONE 80 MG PO CHEW: 40.0000 mg | CHEWABLE_TABLET | Freq: Four times a day (QID) | ORAL | Status: DC | PRN

## 2022-05-18 MED ORDER — KETOROLAC TROMETHAMINE 30 MG/ML IJ SOLN
INTRAMUSCULAR | Status: AC
  Filled 2022-05-18: qty 1

## 2022-05-18 MED ORDER — FENTANYL CITRATE (PF) 100 MCG/2ML IJ SOLN
INTRAMUSCULAR | Status: AC
  Filled 2022-05-18: qty 2

## 2022-05-18 MED ORDER — BUPIVACAINE LIPOSOME 1.3 % IJ SUSP
INTRAMUSCULAR | Status: DC | PRN
  Administered 2022-05-18: 20 mL

## 2022-05-18 MED ORDER — METHOCARBAMOL 1000 MG/10ML IJ SOLN: 500.0000 mg | Freq: Three times a day (TID) | INTRAVENOUS | Status: DC | PRN

## 2022-05-18 MED ORDER — MORPHINE SULFATE (PF) 2 MG/ML IV SOLN
2.0000 mg | INTRAVENOUS | Status: DC | PRN
  Administered 2022-05-18: 2 mg via INTRAVENOUS
  Filled 2022-05-18: qty 1

## 2022-05-18 MED ORDER — OXYCODONE HCL 5 MG PO TABS
5.0000 mg | ORAL_TABLET | ORAL | Status: DC | PRN
  Administered 2022-05-18 – 2022-05-19 (×2): 5 mg via ORAL
  Filled 2022-05-18 (×2): qty 1

## 2022-05-18 MED ORDER — OXYCODONE HCL 5 MG PO TABS
5.0000 mg | ORAL_TABLET | ORAL | 0 refills | Status: DC | PRN
Start: 2022-05-18 — End: 2022-06-01

## 2022-05-18 MED ORDER — LIDOCAINE 2% (20 MG/ML) 5 ML SYRINGE
INTRAMUSCULAR | Status: DC | PRN
  Administered 2022-05-18: 60 mg via INTRAVENOUS

## 2022-05-18 MED ORDER — DEXAMETHASONE SODIUM PHOSPHATE 10 MG/ML IJ SOLN
INTRAMUSCULAR | Status: AC
  Filled 2022-05-18: qty 1

## 2022-05-18 MED ORDER — ACETAMINOPHEN 500 MG PO TABS
1000.0000 mg | ORAL_TABLET | Freq: Four times a day (QID) | ORAL | Status: DC
Start: 2022-05-18 — End: 2022-05-19
  Administered 2022-05-18 – 2022-05-19 (×3): 1000 mg via ORAL
  Filled 2022-05-18 (×3): qty 2

## 2022-05-18 MED ORDER — ONDANSETRON 4 MG PO TBDP: 4.0000 mg | ORAL_TABLET | Freq: Four times a day (QID) | ORAL | 0 refills | Status: DC | PRN

## 2022-05-18 MED ORDER — KETOROLAC TROMETHAMINE 30 MG/ML IJ SOLN
15.0000 mg | Freq: Once | INTRAMUSCULAR | Status: AC
  Administered 2022-05-18: 15 mg via INTRAVENOUS

## 2022-05-18 MED ORDER — PROPOFOL 10 MG/ML IV BOLUS
INTRAVENOUS | Status: DC | PRN
  Administered 2022-05-18: 100 mg via INTRAVENOUS
  Administered 2022-05-18: 30 mg via INTRAVENOUS
  Administered 2022-05-18: 50 mg via INTRAVENOUS

## 2022-05-18 MED ORDER — ONDANSETRON HCL 4 MG/2ML IJ SOLN
INTRAMUSCULAR | Status: AC
  Filled 2022-05-18: qty 2

## 2022-05-18 MED ORDER — LEVOTHYROXINE SODIUM 100 MCG PO TABS
100.0000 ug | ORAL_TABLET | Freq: Every day | ORAL | Status: DC
Start: 2022-05-19 — End: 2022-05-19
  Administered 2022-05-19: 100 ug via ORAL
  Filled 2022-05-18: qty 1

## 2022-05-18 MED ORDER — OMEPRAZOLE MAGNESIUM 20 MG PO TBEC
20.0000 mg | DELAYED_RELEASE_TABLET | Freq: Every day | ORAL | Status: DC
Start: 2022-05-19 — End: 2022-05-18

## 2022-05-18 MED ORDER — METOPROLOL TARTRATE 5 MG/5ML IV SOLN: 5.0000 mg | Freq: Four times a day (QID) | INTRAVENOUS | Status: DC | PRN

## 2022-05-18 MED ORDER — PROPOFOL 500 MG/50ML IV EMUL
INTRAVENOUS | Status: AC
  Filled 2022-05-18: qty 50

## 2022-05-18 MED ORDER — PHENYLEPHRINE 80 MCG/ML (10ML) SYRINGE FOR IV PUSH (FOR BLOOD PRESSURE SUPPORT)
PREFILLED_SYRINGE | INTRAVENOUS | Status: DC | PRN
  Administered 2022-05-18 (×2): 80 ug via INTRAVENOUS

## 2022-05-18 MED ORDER — DEXMEDETOMIDINE (PRECEDEX) IN NS 20 MCG/5ML (4 MCG/ML) IV SYRINGE
PREFILLED_SYRINGE | INTRAVENOUS | Status: DC | PRN
  Administered 2022-05-18 (×4): 4 ug via INTRAVENOUS

## 2022-05-18 MED ORDER — SUGAMMADEX SODIUM 200 MG/2ML IV SOLN
INTRAVENOUS | Status: DC | PRN
  Administered 2022-05-18: 150 mg via INTRAVENOUS

## 2022-05-18 MED ORDER — ALBUTEROL SULFATE HFA 108 (90 BASE) MCG/ACT IN AERS
INHALATION_SPRAY | RESPIRATORY_TRACT | Status: AC
  Filled 2022-05-18: qty 6.7

## 2022-05-18 MED ORDER — OXYCODONE HCL 5 MG/5ML PO SOLN
5.0000 mg | Freq: Once | ORAL | Status: AC | PRN
  Administered 2022-05-18: 5 mg via ORAL
  Filled 2022-05-18: qty 5

## 2022-05-18 MED ORDER — LACTATED RINGERS IV SOLN
INTRAVENOUS | Status: DC
  Administered 2022-05-18: 1000 mL via INTRAVENOUS

## 2022-05-18 MED ORDER — ROSUVASTATIN CALCIUM 5 MG PO TABS
2.5000 mg | ORAL_TABLET | ORAL | Status: DC
  Administered 2022-05-19: 2.5 mg via ORAL
  Filled 2022-05-18: qty 1
  Filled 2022-05-18 (×2): qty 0.5

## 2022-05-18 MED ORDER — ONDANSETRON HCL 4 MG/2ML IJ SOLN: 4.0000 mg | Freq: Once | INTRAMUSCULAR | Status: DC | PRN

## 2022-05-18 MED ORDER — LIDOCAINE HCL (PF) 2 % IJ SOLN
INTRAMUSCULAR | Status: AC
  Filled 2022-05-18: qty 5

## 2022-05-18 MED ORDER — CHLORHEXIDINE GLUCONATE 0.12 % MT SOLN
15.0000 mL | Freq: Once | OROMUCOSAL | Status: AC
  Administered 2022-05-18: 15 mL via OROMUCOSAL

## 2022-05-18 MED ORDER — DEXAMETHASONE SODIUM PHOSPHATE 10 MG/ML IJ SOLN
INTRAMUSCULAR | Status: DC | PRN
  Administered 2022-05-18: 10 mg via INTRAVENOUS

## 2022-05-18 MED ORDER — EPHEDRINE SULFATE-NACL 50-0.9 MG/10ML-% IV SOSY
PREFILLED_SYRINGE | INTRAVENOUS | Status: DC | PRN
  Administered 2022-05-18: 10 mg via INTRAVENOUS

## 2022-05-18 MED ORDER — PHENYLEPHRINE 80 MCG/ML (10ML) SYRINGE FOR IV PUSH (FOR BLOOD PRESSURE SUPPORT)
PREFILLED_SYRINGE | INTRAVENOUS | Status: AC
  Filled 2022-05-18: qty 10

## 2022-05-18 MED ORDER — PROPOFOL 10 MG/ML IV BOLUS
INTRAVENOUS | Status: AC
  Filled 2022-05-18: qty 20

## 2022-05-18 MED ORDER — ORAL CARE MOUTH RINSE: 15.0000 mL | Freq: Once | OROMUCOSAL | Status: AC

## 2022-05-18 MED ORDER — SCOPOLAMINE 1 MG/3DAYS TD PT72
1.0000 | MEDICATED_PATCH | Freq: Once | TRANSDERMAL | Status: DC
  Administered 2022-05-18: 1.5 mg via TRANSDERMAL
  Filled 2022-05-18: qty 1

## 2022-05-18 MED ORDER — CHLORHEXIDINE GLUCONATE CLOTH 2 % EX PADS: 6.0000 | MEDICATED_PAD | Freq: Once | CUTANEOUS | Status: DC

## 2022-05-18 MED ORDER — PANTOPRAZOLE SODIUM 40 MG PO TBEC
40.0000 mg | DELAYED_RELEASE_TABLET | Freq: Every day | ORAL | Status: DC
  Administered 2022-05-19: 40 mg via ORAL
  Filled 2022-05-18: qty 1

## 2022-05-18 MED ORDER — DIPHENHYDRAMINE HCL 50 MG/ML IJ SOLN: 12.5000 mg | Freq: Four times a day (QID) | INTRAMUSCULAR | Status: DC | PRN

## 2022-05-18 MED ORDER — BENAZEPRIL HCL 20 MG PO TABS
20.0000 mg | ORAL_TABLET | Freq: Every day | ORAL | Status: DC
  Administered 2022-05-19: 20 mg via ORAL
  Filled 2022-05-18: qty 1

## 2022-05-18 MED ORDER — OXYCODONE HCL 5 MG PO TABS
5.0000 mg | ORAL_TABLET | Freq: Once | ORAL | Status: AC | PRN
  Filled 2022-05-18: qty 1

## 2022-05-18 MED ORDER — MELATONIN 3 MG PO TABS
9.0000 mg | ORAL_TABLET | Freq: Every day | ORAL | Status: DC
  Filled 2022-05-18: qty 3

## 2022-05-18 MED ORDER — FENTANYL CITRATE (PF) 100 MCG/2ML IJ SOLN
INTRAMUSCULAR | Status: DC | PRN
  Administered 2022-05-18 (×3): 25 ug via INTRAVENOUS
  Administered 2022-05-18 (×2): 50 ug via INTRAVENOUS
  Administered 2022-05-18: 25 ug via INTRAVENOUS
  Administered 2022-05-18: 50 ug via INTRAVENOUS
  Administered 2022-05-18: 25 ug via INTRAVENOUS

## 2022-05-18 MED ORDER — MIDAZOLAM HCL 5 MG/5ML IJ SOLN
INTRAMUSCULAR | Status: DC | PRN
  Administered 2022-05-18: 1 mg via INTRAVENOUS

## 2022-05-18 MED ORDER — DEXMEDETOMIDINE HCL IN NACL 80 MCG/20ML IV SOLN
INTRAVENOUS | Status: AC
  Filled 2022-05-18: qty 20

## 2022-05-18 MED ORDER — BUPIVACAINE LIPOSOME 1.3 % IJ SUSP
INTRAMUSCULAR | Status: AC
  Filled 2022-05-18: qty 20

## 2022-05-18 MED ORDER — ROCURONIUM BROMIDE 100 MG/10ML IV SOLN
INTRAVENOUS | Status: DC | PRN
  Administered 2022-05-18: 50 mg via INTRAVENOUS
  Administered 2022-05-18: 10 mg via INTRAVENOUS

## 2022-05-18 MED ORDER — HEMOSTATIC AGENTS (NO CHARGE) OPTIME
TOPICAL | Status: DC | PRN
  Administered 2022-05-18: 1 via TOPICAL

## 2022-05-18 MED ORDER — LACTATED RINGERS IV SOLN: INTRAVENOUS | Status: DC

## 2022-05-18 MED ORDER — FENTANYL CITRATE PF 50 MCG/ML IJ SOSY
25.0000 ug | PREFILLED_SYRINGE | INTRAMUSCULAR | Status: DC | PRN
  Administered 2022-05-18 (×3): 50 ug via INTRAVENOUS
  Filled 2022-05-18 (×3): qty 1

## 2022-05-18 MED ORDER — ESMOLOL HCL 100 MG/10ML IV SOLN
INTRAVENOUS | Status: AC
  Filled 2022-05-18: qty 10

## 2022-05-18 MED ORDER — AMLODIPINE BESYLATE 5 MG PO TABS
5.0000 mg | ORAL_TABLET | Freq: Every day | ORAL | Status: DC
  Administered 2022-05-19: 5 mg via ORAL
  Filled 2022-05-18: qty 1

## 2022-05-18 MED ORDER — CHLORHEXIDINE GLUCONATE 0.12 % MT SOLN
15.0000 mL | Freq: Once | OROMUCOSAL | Status: AC
Start: 2022-05-18 — End: 2022-05-18

## 2022-05-18 MED ORDER — ALBUTEROL SULFATE HFA 108 (90 BASE) MCG/ACT IN AERS
INHALATION_SPRAY | RESPIRATORY_TRACT | Status: DC | PRN
  Administered 2022-05-18: 2 via RESPIRATORY_TRACT

## 2022-05-18 MED ORDER — SODIUM CHLORIDE 0.9 % IV SOLN
2.0000 g | INTRAVENOUS | Status: AC
  Administered 2022-05-18: 2 g via INTRAVENOUS
  Filled 2022-05-18: qty 2

## 2022-05-18 MED ORDER — DIPHENHYDRAMINE HCL 12.5 MG/5ML PO ELIX: 12.5000 mg | ORAL_SOLUTION | Freq: Four times a day (QID) | ORAL | Status: DC | PRN

## 2022-05-18 SURGICAL SUPPLY — 50 items
ADH SKN CLS APL DERMABOND .7 (GAUZE/BANDAGES/DRESSINGS) ×1
APL PRP STRL LF DISP 70% ISPRP (MISCELLANEOUS) ×1
APPLIER CLIP ROT 10 11.4 M/L (STAPLE) ×1
APR CLP MED LRG 11.4X10 (STAPLE) ×1
BLADE SURG 15 STRL LF DISP TIS (BLADE) ×2 IMPLANT
BLADE SURG 15 STRL SS (BLADE) ×1
CHLORAPREP W/TINT 26 (MISCELLANEOUS) ×2 IMPLANT
CLIP APPLIE ROT 10 11.4 M/L (STAPLE) ×2 IMPLANT
CLOTH BEACON ORANGE TIMEOUT ST (SAFETY) ×2 IMPLANT
COVER LIGHT HANDLE STERIS (MISCELLANEOUS) ×4 IMPLANT
DERMABOND ADVANCED (GAUZE/BANDAGES/DRESSINGS) ×1
DERMABOND ADVANCED .7 DNX12 (GAUZE/BANDAGES/DRESSINGS) ×2 IMPLANT
DRSG OPSITE POSTOP 4X8 (GAUZE/BANDAGES/DRESSINGS) IMPLANT
DRSG TEGADERM 2-3/8X2-3/4 SM (GAUZE/BANDAGES/DRESSINGS) IMPLANT
ELECT REM PT RETURN 9FT ADLT (ELECTROSURGICAL) ×1
ELECTRODE REM PT RTRN 9FT ADLT (ELECTROSURGICAL) ×2 IMPLANT
GLOVE BIO SURGEON STRL SZ 6.5 (GLOVE) ×2 IMPLANT
GLOVE BIOGEL PI IND STRL 6.5 (GLOVE) ×2 IMPLANT
GLOVE BIOGEL PI IND STRL 7.0 (GLOVE) ×4 IMPLANT
GOWN STRL REUS W/TWL LRG LVL3 (GOWN DISPOSABLE) ×6 IMPLANT
HANDLE SUCTION POOLE (INSTRUMENTS) IMPLANT
HEMOSTAT SNOW SURGICEL 2X4 (HEMOSTASIS) ×2 IMPLANT
INST SET LAPROSCOPIC AP (KITS) ×2 IMPLANT
KIT TURNOVER KIT A (KITS) ×2 IMPLANT
LIGASURE LAP ATLAS 10MM 37CM (INSTRUMENTS) IMPLANT
MANIFOLD NEPTUNE II (INSTRUMENTS) ×2 IMPLANT
NDL INSUFFLATION 14GA 120MM (NEEDLE) ×2 IMPLANT
NEEDLE INSUFFLATION 14GA 120MM (NEEDLE) ×1 IMPLANT
NS IRRIG 1000ML POUR BTL (IV SOLUTION) ×2 IMPLANT
PACK LAP CHOLE LZT030E (CUSTOM PROCEDURE TRAY) ×2 IMPLANT
PAD ARMBOARD 7.5X6 YLW CONV (MISCELLANEOUS) ×2 IMPLANT
PENCIL SMOKE EVACUATOR (MISCELLANEOUS) IMPLANT
SET BASIN LINEN APH (SET/KITS/TRAYS/PACK) ×2 IMPLANT
SET TUBE SMOKE EVAC HIGH FLOW (TUBING) ×2 IMPLANT
SLEEVE Z-THREAD 5X100MM (TROCAR) ×2 IMPLANT
SPONGE GAUZE 2X2 8PLY STRL LF (GAUZE/BANDAGES/DRESSINGS) IMPLANT
SPONGE T-LAP 18X18 ~~LOC~~+RFID (SPONGE) IMPLANT
STAPLER VISISTAT (STAPLE) IMPLANT
SUCTION POOLE HANDLE (INSTRUMENTS) ×1
SUT MNCRL AB 4-0 PS2 18 (SUTURE) ×4 IMPLANT
SUT PDS AB 0 CTX 60 (SUTURE) IMPLANT
SUT VICRYL 0 UR6 27IN ABS (SUTURE) ×2 IMPLANT
SYS BAG RETRIEVAL 10MM (BASKET) ×1
SYSTEM BAG RETRIEVAL 10MM (BASKET) ×2 IMPLANT
TROCAR Z-THRD FIOS HNDL 11X100 (TROCAR) ×2 IMPLANT
TROCAR Z-THREAD FIOS 5X100MM (TROCAR) ×2 IMPLANT
TROCAR Z-THREAD SLEEVE 11X100 (TROCAR) ×2 IMPLANT
TUBE CONNECTING 12X1/4 (SUCTIONS) ×2 IMPLANT
WARMER LAPAROSCOPE (MISCELLANEOUS) ×2 IMPLANT
YANKAUER SUCT 12FT TUBE ARGYLE (SUCTIONS) IMPLANT

## 2022-05-18 NOTE — Interval H&P Note (Signed)
History and Physical Interval Note:  05/18/2022 8:12 AM  Erin Watkins  has presented today for surgery, with the diagnosis of Cholelithiasis, small bowel obstruction.  The various methods of treatment have been discussed with the patient and family. After consideration of risks, benefits and other options for treatment, the patient has consented to  Procedure(s): LAPAROSCOPIC CHOLECYSTECTOMY (N/A) LAPAROSCOPY DIAGNOSTIC (N/A) as a surgical intervention.  The patient's history has been reviewed, patient examined, no change in status, stable for surgery.  I have reviewed the patient's chart and labs.  Questions were answered to the patient's satisfaction.     Lucretia Roers

## 2022-05-18 NOTE — Progress Notes (Signed)
[facility]  Rx sent to pharmacy. Will just need dc orders tomorrow as long as doing well.   Algis Greenhouse, MD [facility] [address] [place], [place] [address] [phone] (office)

## 2022-05-18 NOTE — Anesthesia Postprocedure Evaluation (Signed)
Anesthesia Post Note  Patient: Erin Watkins  Procedure(s) Performed: LAPAROSCOPIC CHOLECYSTECTOMY LAPAROSCOPY DIAGNOSTIC, OPEN REDUCTION OF INTERNAL HERNIA  Patient location during evaluation: Phase II Anesthesia Type: General Level of consciousness: awake Pain management: pain level controlled Vital Signs Assessment: post-procedure vital signs reviewed and stable Respiratory status: spontaneous breathing and respiratory function stable Cardiovascular status: blood pressure returned to baseline and stable Postop Assessment: no headache and no apparent nausea or vomiting Anesthetic complications: no Comments: Late entry   No notable events documented.   Last Vitals:  Vitals:   05/18/22 1152 05/18/22 1200  BP:  (!) 196/82  Pulse: 79 77  Resp: 18 15  Temp:    SpO2: 95% 92%    Last Pain:  Vitals:   05/18/22 1224  TempSrc:   PainSc: 8                  Windell Norfolk

## 2022-05-18 NOTE — Discharge Instructions (Signed)
Discharge Open Abdominal Surgery Instructions:  Common Complaints: Pain at the incision site is common. This will improve with time. Take your pain medications as described below. Some nausea is common and poor appetite. The main goal is to stay hydrated the first few days after surgery.   Diet/ Activity: Diet as tolerated. You have started and tolerated a diet in the hospital, and should continue to increase what you are able to eat.   You may not have a large appetite, but it is important to stay hydrated. Drink 64 ounces of water a day. Your appetite will return with time.  You can remove your dressings after 05/20/2022.  Keep a dry dressing in place over your staples daily or as needed. Some minor pink/ blood tinged drainage is expected. This will stop in a few days after surgery.  Shower per your regular routine daily.   Wear an abdominal binder daily with activity. You do not have to wear this while sleeping or sitting.  Rest and listen to your body, but do not remain in bed all day.  Walk everyday for at least 15-20 minutes. Deep cough and move around every 1-2 hours in the first few days after surgery.  Do not lift > 10 lbs, perform excessive bending, pushing, pulling, squatting for 6-8 weeks after surgery.  The activity restrictions and the abdominal binder are to prevent hernia formation at your incision while you are healing.  Do not place lotions or balms on your incision unless instructed to specifically by Dr. Henreitta Leber.   Pain Expectations and Narcotics: -After surgery you will have pain associated with your incisions and this is normal. The pain is muscular and nerve pain, and will get better with time. -You are encouraged and expected to take non narcotic medications like tylenol and ibuprofen (when able) to treat pain as multiple modalities can aid with pain treatment. -Narcotics are only used when pain is severe or there is breakthrough pain. -You are not expected to have a pain  score of 0 after surgery, as we cannot prevent pain. A pain score of 3-4 that allows you to be functional, move, walk, and tolerate some activity is the goal. The pain will continue to improve over the days after surgery and is dependent on your surgery. -Due to [place] law, we are only able to give a certain amount of pain medication to treat post operative pain, and we only give additional narcotics on a patient by patient basis.  -For most laparoscopic surgery, studies have shown that the majority of patients only need 10-15 narcotic pills, and for open surgeries most patients only need 15-20.   -Having appropriate expectations of pain and knowledge of pain management with non narcotics is important as we do not want anyone to become addicted to narcotic pain medication.  -Using ice packs in the first 48 hours and heating pads after 48 hours, wearing an abdominal binder (when recommended), and using over the counter medications are all ways to help with pain management.   -Simple acts like meditation and mindfulness practices after surgery can also help with pain control and research has proven the benefit of these practices.  Medication: Take tylenol and ibuprofen as needed for pain control, alternating every 4-6 hours.  Example:  Tylenol 1000mg  @ 6am, 12noon, 6pm, (Do not exceed 4000mg  of tylenol a day). Ibuprofen 800mg  @ 9am, 3pm, 9pm, 3am (Do not exceed 3600mg  of ibuprofen a day).  Take Roxicodone for breakthrough pain every 4 hours.  Take Colace for constipation related to narcotic pain medication. If you do not have a bowel movement in 2 days, take Miralax over the counter.  Drink plenty of water to also prevent constipation.   Contact Information: If you have questions or concerns, please call our office, [phone], Monday- Thursday 8AM-5PM and Friday 8AM-12Noon.  If it is after hours or on the weekend, please call [facility], [phone], [phone], and ask to speak  to the surgeon on call for Dr. Henreitta Leber at [facility].

## 2022-05-18 NOTE — Progress Notes (Signed)
[facility]  Updated husband. Since I had to do a mini ex lap plan to keep overnight for pain control. Will let have a diet and if doing well dc home tomorrow. Dr. Lovell Sheehan will likely see her tomorrow for dc.  Will send in RX to pharmacy for pain and see her in follow up for staple removal.  Future Appointments  Date Time Provider Department Center  06/01/2022 12:00 PM Lucretia Roers, MD [facility] None     Algis Greenhouse, MD [facility] [address] [place], [place] [address] [phone] (office)

## 2022-05-18 NOTE — Op Note (Signed)
Operative Note   Preoperative Diagnosis: Symptomatic cholelithiasis   Postoperative Diagnosis: Chronic cholecystitis, hydrops and internal hernia of the bowel    Procedure(s) Performed: Laparoscopic cholecystectomy, laparoscopic converted to open reduction of internal hernia and lysis of adhesions    Surgeon: Leatrice Jewels. Henreitta Leber, MD   Assistants: No qualified resident was available   Anesthesia: General endotracheal   Anesthesiologist: Windell Norfolk, MD    Specimens: Gallbladder    Estimated Blood Loss: Minimal    Blood Replacement: None    Complications: None    Operative Findings:  Chroinc inflammation of the gallbladder with hydrops, internal hernia with narrow mesenteric base with proximal bowel going under a pocket where the terminal ileum mesentery fused to the pelvic brim and flipping back over the terminal ileum.           Procedure: The patient was taken to the operating room and placed supine. General endotracheal anesthesia was induced. Intravenous antibiotics were  administered per protocol. An orogastric tube positioned to decompress the stomach. The abdomen was prepared and draped in the usual sterile fashion.    A supraumbilical incision was made and a Veress technique was utilized to achieve pneumoperitoneum to 15 mmHg with carbon dioxide. A 11 mm optiview port was placed through the supraumbilical region, and a 10 mm 0-degree operative laparoscope was introduced. The area underlying the trocar and Veress needle were inspected and without evidence of injury.  Remaining trocars were placed under direct vision. Two 5 mm ports were placed in the right abdomen, between the anterior axillary and midclavicular line.  A final 11 mm port was placed through the mid-epigastrium, near the falciform ligament.    The gallbladder was full and inflamed. The fundus was elevated cephalad and the infundibulum was retracted to the patient's right. The gallbladder/cystic duct  junction was skeletonized. The cystic artery noted in the triangle of Calot and was also skeletonized.  We then continued liberal medial and lateral dissection until the critical view of safety was achieved. The bowel was entered with lateral dissection and clear fluid was noted indicating hydrops from chronic cholecystitis and blockage.    The cystic duct and cystic artery were doubly clipped and divided. The gallbladder was then dissected from the liver bed with electrocautery. The specimen was placed in an Endopouch and was retrieved through the epigastric site.   Final inspection revealed acceptable hemostasis. Surgical SNOW was placed in the gallbladder bed.  The epigastric trocar was removed and the 0 Vicryl suture closed the fascia. The lateral 5 mm port was removed to use for the second part of the case.  Attention was then turned to the lower abdomen. She was placed with her head down and right side up. Adhesions of omentum to the anterior abdominal wall were taken down with the hook cautery from the remaining 5 mm trocar. A 11 mm trocar was placed in the left lower quadrant under direct visualization.  A 5 mm trocar was placed in the suprapubic region. Trocars were removed and pneumoperitoneum was released.  I ran the small bowel from the terminal ileum back a ways and noted a loop of bowel going under a defect from the terminal ileum mesentery fused to the pelvic brim and flipping back over the small bowel between the terminal ileum and cecum. I reduced this as much as I could but then had to stop and reposition on the other side of the patient and use a 30 degree scope.   I then ran  more proximally attempting to get to the ligament of treitz and could never locate the ligament of treitz but noted bowel looping under towards the direction of the bowel going into this pocket. It was clear this was an internal hernia. I tried for over 40 minutes to ensure I had reduced the hernia but I was not  satisfied that I had. I decided to open the midline to ensure adequate reduction and lysis of the adhesion creating this pocket.   A midline incision about 3-4 inches was made and a small wound protector was placed. The bowel was eviscerated and I had in fact reduced everything an o mesentery was twisted. She had an extremely narrowed mesenteric pedicle at 2 inches max and long mesentery. The terminal ileum mesentery was adhesed to the pelvic brim retroperitoneal layer in a way that had formed a pocket. This was lysed to prevent a repeat internal hernia.   The bowel was ran again and no injuries or twisting was noted. The midline was closed with 0 PDS. The 11 mm trocar site was closed with 0 Vicryl interrupted. The skin of the port sites and midline were closed with staples. Gauze dressings were placed on the port sites and a honeycomb on the midline.   The patient was awakened from anesthesia and extubated without complication. She will be monitored overnight to ensure she is able to eat and her pain is controlled.    Algis Greenhouse, MD [facility] [address] [place], [place] [address] [phone] (office)

## 2022-05-18 NOTE — Anesthesia Preprocedure Evaluation (Signed)
Anesthesia Evaluation  Patient identified by MRN, date of birth, ID band Patient awake    Reviewed: Allergy & Precautions, H&P , NPO status , Patient's Chart, lab work & pertinent test results, reviewed documented beta blocker date and time   Airway Mallampati: II  TM Distance: >3 FB Neck ROM: full    Dental no notable dental hx.    Pulmonary neg pulmonary ROS, Current Smoker,    Pulmonary exam normal breath sounds clear to auscultation       Cardiovascular Exercise Tolerance: Good hypertension, negative cardio ROS   Rhythm:regular Rate:Normal     Neuro/Psych PSYCHIATRIC DISORDERS Anxiety negative neurological ROS     GI/Hepatic Neg liver ROS, GERD  Medicated,  Endo/Other  Hypothyroidism   Renal/GU negative Renal ROS  negative genitourinary   Musculoskeletal   Abdominal   Peds  Hematology negative hematology ROS (+)   Anesthesia Other Findings   Reproductive/Obstetrics negative OB ROS                             Anesthesia Physical Anesthesia Plan  ASA: 2  Anesthesia Plan: General and General ETT   Post-op Pain Management:    Induction:   PONV Risk Score and Plan: Ondansetron and Scopolamine patch - Pre-op  Airway Management Planned:   Additional Equipment:   Intra-op Plan:   Post-operative Plan:   Informed Consent: I have reviewed the patients History and Physical, chart, labs and discussed the procedure including the risks, benefits and alternatives for the proposed anesthesia with the patient or authorized representative who has indicated his/her understanding and acceptance.     Dental Advisory Given  Plan Discussed with: CRNA  Anesthesia Plan Comments:         Anesthesia Quick Evaluation

## 2022-05-18 NOTE — Transfer of Care (Signed)
Immediate Anesthesia Transfer of Care Note  Patient: Erin Watkins  Procedure(s) Performed: LAPAROSCOPIC CHOLECYSTECTOMY LAPAROSCOPY DIAGNOSTIC, OPEN REDUCTION OF INTERNAL HERNIA  Patient Location: PACU  Anesthesia Type:General  Level of Consciousness: drowsy  Airway & Oxygen Therapy: Patient Spontanous Breathing and Patient connected to face mask oxygen  Post-op Assessment: Report given to RN and Post -op Vital signs reviewed and stable  Post vital signs: Reviewed and stable  Last Vitals:  Vitals Value Taken Time  BP 163/66 05/18/22 1115  Temp    Pulse 70 05/18/22 1121  Resp 15 05/18/22 1121  SpO2 97 % 05/18/22 1121  Vitals shown include unvalidated device data.  Last Pain:  Vitals:   05/18/22 0809  TempSrc: Oral  PainSc: 0-No pain      Patients Stated Pain Goal: 7 (05/18/22 0809)  Complications: No notable events documented.

## 2022-05-18 NOTE — Anesthesia Procedure Notes (Signed)
Procedure Name: Intubation Date/Time: 05/18/2022 8:45 AM  Performed by: Gwyndolyn Saxon, CRNAPre-anesthesia Checklist: Patient identified, Emergency Drugs available, Suction available and Patient being monitored Patient Re-evaluated:Patient Re-evaluated prior to induction Oxygen Delivery Method: Circle system utilized Preoxygenation: Pre-oxygenation with 100% oxygen Induction Type: IV induction Ventilation: Mask ventilation without difficulty Laryngoscope Size: Mac and 3 Grade View: Grade III Tube type: Oral Tube size: 7.0 mm Number of attempts: 2 Airway Equipment and Method: Patient positioned with wedge pillow and Stylet Placement Confirmation: positive ETCO2 and breath sounds checked- equal and bilateral Secured at: 20 cm Tube secured with: Tape Dental Injury: Teeth and Oropharynx as per pre-operative assessment  Difficulty Due To: Difficult Airway- due to reduced neck mobility Comments: Easy mask airway. DL #1 with miller 2, no view; DL #2 with MAC 3; grade III view with cricoid pressure. ETT easily passed.

## 2022-05-19 LAB — CBC
HCT: 40.6 % (ref 36.0–46.0)
Hemoglobin: 13.4 g/dL (ref 12.0–15.0)
MCH: 28.5 pg (ref 26.0–34.0)
MCHC: 33 g/dL (ref 30.0–36.0)
MCV: 86.4 fL (ref 80.0–100.0)
Platelets: 251 10*3/uL (ref 150–400)
RBC: 4.7 MIL/uL (ref 3.87–5.11)
RDW: 13.8 % (ref 11.5–15.5)
WBC: 22.1 10*3/uL — ABNORMAL HIGH (ref 4.0–10.5)
nRBC: 0 % (ref 0.0–0.2)

## 2022-05-19 LAB — HEPATIC FUNCTION PANEL
ALT: 35 U/L (ref 0–44)
AST: 42 U/L — ABNORMAL HIGH (ref 15–41)
Albumin: 3.2 g/dL — ABNORMAL LOW (ref 3.5–5.0)
Alkaline Phosphatase: 47 U/L (ref 38–126)
Bilirubin, Direct: 0.1 mg/dL (ref 0.0–0.2)
Indirect Bilirubin: 0.6 mg/dL (ref 0.3–0.9)
Total Bilirubin: 0.7 mg/dL (ref 0.3–1.2)
Total Protein: 6.1 g/dL — ABNORMAL LOW (ref 6.5–8.1)

## 2022-05-19 LAB — BASIC METABOLIC PANEL
Anion gap: 7 (ref 5–15)
BUN: 18 mg/dL (ref 8–23)
CO2: 25 mmol/L (ref 22–32)
Calcium: 8.4 mg/dL — ABNORMAL LOW (ref 8.9–10.3)
Chloride: 105 mmol/L (ref 98–111)
Creatinine, Ser: 0.82 mg/dL (ref 0.44–1.00)
GFR, Estimated: >60 mL/min (ref >60)
Glucose, Bld: 165 mg/dL — ABNORMAL HIGH (ref 70–99)
Potassium: 3.8 mmol/L (ref 3.5–5.1)
Sodium: 137 mmol/L (ref 135–145)

## 2022-05-19 MED ORDER — ALUM & MAG HYDROXIDE-SIMETH 200-200-20 MG/5ML PO SUSP
30.0000 mL | Freq: Four times a day (QID) | ORAL | Status: DC | PRN
Start: 2022-05-19 — End: 2022-05-19
  Administered 2022-05-19: 30 mL via ORAL
  Filled 2022-05-19: qty 30

## 2022-05-19 NOTE — Progress Notes (Signed)
Patient stable and ready for discharge home. Writer removed IV without issues. Writer went over discharge paperwork with patient and husband and both verbalized understanding and had no questions. Patient dressed herself and packed her belongings. Writer transported patient via WC to husband's car for discharge.

## 2022-05-19 NOTE — Progress Notes (Signed)
150 noted from the [facility]. MD Zierle-Ghosh said to bladder scan pt. 280 was noted. Received order to do in and out cath if greater than 250.

## 2022-05-19 NOTE — Progress Notes (Signed)
In and out done. 450 noted. Patient tolerated well. Will continue to monitor.

## 2022-05-19 NOTE — Progress Notes (Signed)
Patient up to the [facility]. Patient is having difficulty urinating. States it hurts to go. Dribbles coming out. MD Zierle-Ghosh notified.

## 2022-05-19 NOTE — Progress Notes (Addendum)
Unable to find any documentation regarding urine output besides on 8/31 at 0752. During my shift so far patient has not voided. Patient says she feels like she needs to go but she isn't able too. Patient bladder scanned by Davy Bing. 369 noted. Patient tried the bedpan. Unable to void. Patient got up to the [facility] starting coughing and some urine output got on floor. 100 was noted from [facility]. Will continue to monitor. MD Zierle-Ghosh made aware.

## 2022-05-19 NOTE — Discharge Summary (Signed)
Physician Discharge Summary  Patient ID: Erin Watkins MRN: [id number] DOB/AGE: [birth year] 76 y.o.  Admit date: 05/18/2022 Discharge date: 05/19/2022  Admission Diagnoses: Cholelithiasis without cholecystitis  Discharge Diagnoses:  Principal Problem:   Calculus of gallbladder without cholecystitis without obstruction Active Problems:   SBO (small bowel obstruction) (HCC)   Internal hernia   Chronic cholecystitis   Hydrops of gallbladder   Discharged Condition: good  Hospital Course: Patient is a 76 year old white female who presented to [facility] for laparoscopic cholecystectomy.  This was performed on 05/18/2022 by Dr. Henreitta Leber.  During the surgery, she was noted to have a loop of bowel that appeared to be herniating.  It was elected to extend the incision in order to release any adhesions.  The patient tolerated the procedure well.  Her postoperative course has been unremarkable.  Her diet was advanced without difficulty.  She was noted to have leukocytosis on postoperative day 1, but this is felt to be secondary to Decadron administration during her surgery.  She is being discharged home in good and improving condition on 05/19/2022.  Treatments: surgery: Laparoscopic cholecystectomy, open reduction of internal hernia and lysis of adhesions on 05/18/2022  Discharge Exam: Blood pressure (!) 186/67, pulse 79, temperature 98.1 F (36.7 C), resp. rate 19, height 5\' 6"  (1.676 m), weight 73.5 kg, SpO2 95 %. General appearance: alert, cooperative, and no distress Resp: clear to auscultation bilaterally Cardio: regular rate and rhythm, S1, S2 normal, no murmur, click, rub or gallop GI: Soft, incisions healing well.  Disposition: Discharge disposition: 01-Home or Self Care       Discharge Instructions     Call MD for:  difficulty breathing, headache or visual disturbances   Complete by: As directed    Call MD for:  extreme fatigue   Complete by: As directed    Call MD for:   hives   Complete by: As directed    Call MD for:  persistant dizziness or light-headedness   Complete by: As directed    Call MD for:  persistant nausea and vomiting   Complete by: As directed    Call MD for:  redness, tenderness, or signs of infection (pain, swelling, redness, odor or green/yellow discharge around incision site)   Complete by: As directed    Call MD for:  severe uncontrolled pain   Complete by: As directed    Call MD for:  temperature >100.4   Complete by: As directed    Diet - low sodium heart healthy   Complete by: As directed    Increase activity slowly   Complete by: As directed    Increase activity slowly   Complete by: As directed       Allergies as of 05/19/2022   No Known Allergies      Medication List     TAKE these medications    amLODipine 5 MG tablet Commonly known as: NORVASC Take 5 mg by mouth daily.   aspirin 325 MG tablet Take 325 mg by mouth every 6 (six) hours as needed for moderate pain.   benazepril 40 MG tablet Commonly known as: LOTENSIN Take 0.5 tablets (20 mg total) by mouth daily. (Needs to be seen before next refill)   levothyroxine 100 MCG tablet Commonly known as: SYNTHROID Take 100 mcg by mouth daily before breakfast.   Melatonin 5 MG Chew Chew 10 mg by mouth at bedtime.   omeprazole 20 MG tablet Commonly known as: PRILOSEC OTC Take 20 mg by mouth  daily.   ondansetron 4 MG disintegrating tablet Commonly known as: ZOFRAN-ODT Take 1 tablet (4 mg total) by mouth every 6 (six) hours as needed for nausea.   oxyCODONE 5 MG immediate release tablet Commonly known as: Oxy IR/ROXICODONE Take 1 tablet (5 mg total) by mouth every 4 (four) hours as needed for severe pain or breakthrough pain.   rosuvastatin 5 MG tablet Commonly known as: CRESTOR Take 2.5 mg by mouth every other day.        Follow-up Information     Lucretia Roers, MD Follow up on 06/01/2022.   Specialty: General Surgery Why: stapel  removal Contact information: [address] [place] [facility] [address] [phone]                 Signed: Franky Macho 05/19/2022, 11:28 AM

## 2022-05-23 LAB — SURGICAL PATHOLOGY

## 2022-05-25 ENCOUNTER — Encounter (HOSPITAL_COMMUNITY): Payer: Self-pay | Admitting: General Surgery

## 2022-06-01 ENCOUNTER — Encounter: Payer: Self-pay | Admitting: General Surgery

## 2022-06-01 ENCOUNTER — Ambulatory Visit (INDEPENDENT_AMBULATORY_CARE_PROVIDER_SITE_OTHER): Payer: Medicare Other | Admitting: General Surgery

## 2022-06-01 VITALS — BP 192/82 | HR 70 | Temp 98.1°F | Resp 16 | Ht 66.0 in | Wt 136.0 lb

## 2022-06-01 DIAGNOSIS — K458 Other specified abdominal hernia without obstruction or gangrene: Secondary | ICD-10-CM

## 2022-06-01 DIAGNOSIS — K811 Chronic cholecystitis: Secondary | ICD-10-CM

## 2022-06-01 DIAGNOSIS — K580 Irritable bowel syndrome with diarrhea: Secondary | ICD-10-CM

## 2022-06-01 MED ORDER — OXYCODONE HCL 5 MG PO TABS: 5.0000 mg | ORAL_TABLET | ORAL | 0 refills | Status: DC | PRN

## 2022-06-01 MED ORDER — DICYCLOMINE HCL 10 MG PO CAPS: 10.0000 mg | ORAL_CAPSULE | Freq: Three times a day (TID) | ORAL | 1 refills | Status: AC

## 2022-06-01 NOTE — Patient Instructions (Addendum)
Take Bentyl for bowel symptoms and see if it helps. It can cause some issues with memory. If you notice anything then stop the medication.

## 2022-06-01 NOTE — Progress Notes (Unsigned)
[facility]  Sore but overall improving. Still with diarrhea after every meal and at other times a day. Bloating. Eating. Did have some minor nausea.  BP (!) 192/82   Pulse 70   Temp 98.1 F (36.7 C) (Oral)   Resp 16   Ht 5\' 6"  (1.676 m)   Wt 136 lb (61.7 kg)   SpO2 93%   BMI 21.95 kg/m  Staples at port and midline removed, steri strips placed, no erythema or drainage  Patient s/p Laparoscopic cholecystectomy for chronic cholecystitis and laparoscopic to open exploration and reduction of an internal hernia. She says she has been told she has IBS but does not see GI. Has never tried Bentyl.  Discussed risk of bentyl in elderly and discussed her monitoring.   Take Bentyl for bowel symptoms and see if it helps. It can cause some issues with memory. If you notice anything then stop the medication.  Future Appointments  Date Time Provider Department Center  07/04/2022 11:15 AM 07/06/2022, MD [facility] None   Lucretia Roers, MD [facility] [address] [place], [place] [phone] (office)

## 2022-06-26 DIAGNOSIS — I700 Atherosclerosis of aorta: Secondary | ICD-10-CM | POA: Diagnosis not present

## 2022-06-26 DIAGNOSIS — I2510 Atherosclerotic heart disease of native coronary artery without angina pectoris: Secondary | ICD-10-CM | POA: Diagnosis not present

## 2022-06-26 DIAGNOSIS — Z6823 Body mass index (BMI) 23.0-23.9, adult: Secondary | ICD-10-CM | POA: Diagnosis not present

## 2022-06-26 DIAGNOSIS — Z299 Encounter for prophylactic measures, unspecified: Secondary | ICD-10-CM | POA: Diagnosis not present

## 2022-06-26 DIAGNOSIS — Z0000 Encounter for general adult medical examination without abnormal findings: Secondary | ICD-10-CM | POA: Diagnosis not present

## 2022-06-26 DIAGNOSIS — Z7189 Other specified counseling: Secondary | ICD-10-CM | POA: Diagnosis not present

## 2022-06-26 DIAGNOSIS — I10 Essential (primary) hypertension: Secondary | ICD-10-CM | POA: Diagnosis not present

## 2022-06-26 DIAGNOSIS — F17210 Nicotine dependence, cigarettes, uncomplicated: Secondary | ICD-10-CM | POA: Diagnosis not present

## 2022-07-04 ENCOUNTER — Encounter: Payer: Self-pay | Admitting: General Surgery

## 2022-07-04 ENCOUNTER — Ambulatory Visit (INDEPENDENT_AMBULATORY_CARE_PROVIDER_SITE_OTHER): Payer: Medicare Other | Admitting: General Surgery

## 2022-07-04 VITALS — BP 183/100 | HR 81 | Temp 98.1°F | Resp 14 | Ht 66.0 in | Wt 138.0 lb

## 2022-07-04 DIAGNOSIS — K580 Irritable bowel syndrome with diarrhea: Secondary | ICD-10-CM

## 2022-07-04 NOTE — Progress Notes (Signed)
[facility]  Feeling better than she did prior to surgery. She still has some issues with diarrhea at times but no major issues with bloating.   She is using bentyl and says it helps some.   BP (!) 183/100   Pulse 81   Temp 98.1 F (36.7 C) (Oral)   Resp 14   Ht 5\' 6"  (1.676 m)   Wt 138 lb (62.6 kg)   SpO2 93%   BMI 22.27 kg/m  Incisions healed   Patient s/p laparoscopic cholecystectomy and laparoscopic converted to open reduction of internal hernia and lysis of adhesions.   Doing better.  Diet as tolerated Bentyl as needed  Dr. Woody Seller should be able to prescribe bentyl again if this is working for you  Can also refer to GI if she wants in the future, she wants to hold for now   Curlene Labrum, MD [facility] [address] [place], [place] [address] [phone] (office)

## 2022-07-04 NOTE — Patient Instructions (Signed)
Take bentyl as you need. Dr. Woody Seller should be able to prescribe this again.

## 2022-07-13 DIAGNOSIS — F17210 Nicotine dependence, cigarettes, uncomplicated: Secondary | ICD-10-CM | POA: Diagnosis not present

## 2022-07-13 DIAGNOSIS — Z299 Encounter for prophylactic measures, unspecified: Secondary | ICD-10-CM | POA: Diagnosis not present

## 2022-07-13 DIAGNOSIS — Z79899 Other long term (current) drug therapy: Secondary | ICD-10-CM | POA: Diagnosis not present

## 2022-07-13 DIAGNOSIS — Z6822 Body mass index (BMI) 22.0-22.9, adult: Secondary | ICD-10-CM | POA: Diagnosis not present

## 2022-07-13 DIAGNOSIS — Z0000 Encounter for general adult medical examination without abnormal findings: Secondary | ICD-10-CM | POA: Diagnosis not present

## 2022-07-13 DIAGNOSIS — R5383 Other fatigue: Secondary | ICD-10-CM | POA: Diagnosis not present

## 2022-07-13 DIAGNOSIS — I10 Essential (primary) hypertension: Secondary | ICD-10-CM | POA: Diagnosis not present

## 2022-07-13 DIAGNOSIS — E7800 Pure hypercholesterolemia, unspecified: Secondary | ICD-10-CM | POA: Diagnosis not present

## 2022-09-25 DIAGNOSIS — F17210 Nicotine dependence, cigarettes, uncomplicated: Secondary | ICD-10-CM | POA: Diagnosis not present

## 2022-09-25 DIAGNOSIS — E0590 Thyrotoxicosis, unspecified without thyrotoxic crisis or storm: Secondary | ICD-10-CM | POA: Diagnosis not present

## 2022-09-25 DIAGNOSIS — I10 Essential (primary) hypertension: Secondary | ICD-10-CM | POA: Diagnosis not present

## 2022-09-25 DIAGNOSIS — Z299 Encounter for prophylactic measures, unspecified: Secondary | ICD-10-CM | POA: Diagnosis not present

## 2022-09-25 DIAGNOSIS — D692 Other nonthrombocytopenic purpura: Secondary | ICD-10-CM | POA: Diagnosis not present

## 2022-11-24 DIAGNOSIS — J209 Acute bronchitis, unspecified: Secondary | ICD-10-CM | POA: Diagnosis not present

## 2022-11-24 DIAGNOSIS — Z299 Encounter for prophylactic measures, unspecified: Secondary | ICD-10-CM | POA: Diagnosis not present

## 2022-11-24 DIAGNOSIS — R0602 Shortness of breath: Secondary | ICD-10-CM | POA: Diagnosis not present

## 2022-11-24 DIAGNOSIS — R059 Cough, unspecified: Secondary | ICD-10-CM | POA: Diagnosis not present

## 2022-11-24 DIAGNOSIS — M25551 Pain in right hip: Secondary | ICD-10-CM | POA: Diagnosis not present

## 2022-11-24 DIAGNOSIS — F17210 Nicotine dependence, cigarettes, uncomplicated: Secondary | ICD-10-CM | POA: Diagnosis not present

## 2022-11-24 DIAGNOSIS — R053 Chronic cough: Secondary | ICD-10-CM | POA: Diagnosis not present

## 2022-11-24 DIAGNOSIS — I10 Essential (primary) hypertension: Secondary | ICD-10-CM | POA: Diagnosis not present

## 2023-03-01 DIAGNOSIS — I10 Essential (primary) hypertension: Secondary | ICD-10-CM | POA: Diagnosis not present

## 2023-03-01 DIAGNOSIS — I2510 Atherosclerotic heart disease of native coronary artery without angina pectoris: Secondary | ICD-10-CM | POA: Diagnosis not present

## 2023-03-01 DIAGNOSIS — F17210 Nicotine dependence, cigarettes, uncomplicated: Secondary | ICD-10-CM | POA: Diagnosis not present

## 2023-03-01 DIAGNOSIS — I700 Atherosclerosis of aorta: Secondary | ICD-10-CM | POA: Diagnosis not present

## 2023-03-01 DIAGNOSIS — Z299 Encounter for prophylactic measures, unspecified: Secondary | ICD-10-CM | POA: Diagnosis not present

## 2023-03-01 DIAGNOSIS — Z7189 Other specified counseling: Secondary | ICD-10-CM | POA: Diagnosis not present

## 2023-03-01 DIAGNOSIS — Z0000 Encounter for general adult medical examination without abnormal findings: Secondary | ICD-10-CM | POA: Diagnosis not present

## 2023-06-25 DIAGNOSIS — I2510 Atherosclerotic heart disease of native coronary artery without angina pectoris: Secondary | ICD-10-CM | POA: Diagnosis not present

## 2023-06-25 DIAGNOSIS — F17210 Nicotine dependence, cigarettes, uncomplicated: Secondary | ICD-10-CM | POA: Diagnosis not present

## 2023-06-25 DIAGNOSIS — I10 Essential (primary) hypertension: Secondary | ICD-10-CM | POA: Diagnosis not present

## 2023-06-25 DIAGNOSIS — K219 Gastro-esophageal reflux disease without esophagitis: Secondary | ICD-10-CM | POA: Diagnosis not present

## 2023-06-25 DIAGNOSIS — Z2821 Immunization not carried out because of patient refusal: Secondary | ICD-10-CM | POA: Diagnosis not present

## 2023-06-25 DIAGNOSIS — I700 Atherosclerosis of aorta: Secondary | ICD-10-CM | POA: Diagnosis not present

## 2023-06-25 DIAGNOSIS — Z299 Encounter for prophylactic measures, unspecified: Secondary | ICD-10-CM | POA: Diagnosis not present

## 2023-07-03 DIAGNOSIS — F17210 Nicotine dependence, cigarettes, uncomplicated: Secondary | ICD-10-CM | POA: Diagnosis not present

## 2023-07-03 DIAGNOSIS — Z79899 Other long term (current) drug therapy: Secondary | ICD-10-CM | POA: Diagnosis not present

## 2023-07-03 DIAGNOSIS — R5383 Other fatigue: Secondary | ICD-10-CM | POA: Diagnosis not present

## 2023-07-03 DIAGNOSIS — E7800 Pure hypercholesterolemia, unspecified: Secondary | ICD-10-CM | POA: Diagnosis not present

## 2023-07-03 DIAGNOSIS — Z299 Encounter for prophylactic measures, unspecified: Secondary | ICD-10-CM | POA: Diagnosis not present

## 2023-07-03 DIAGNOSIS — I10 Essential (primary) hypertension: Secondary | ICD-10-CM | POA: Diagnosis not present

## 2023-07-03 DIAGNOSIS — Z0000 Encounter for general adult medical examination without abnormal findings: Secondary | ICD-10-CM | POA: Diagnosis not present

## 2023-08-10 DIAGNOSIS — Z299 Encounter for prophylactic measures, unspecified: Secondary | ICD-10-CM | POA: Diagnosis not present

## 2023-08-10 DIAGNOSIS — I10 Essential (primary) hypertension: Secondary | ICD-10-CM | POA: Diagnosis not present

## 2023-08-10 DIAGNOSIS — M7121 Synovial cyst of popliteal space [Baker], right knee: Secondary | ICD-10-CM | POA: Diagnosis not present

## 2023-08-22 DIAGNOSIS — F17210 Nicotine dependence, cigarettes, uncomplicated: Secondary | ICD-10-CM | POA: Diagnosis not present

## 2023-08-22 DIAGNOSIS — K589 Irritable bowel syndrome without diarrhea: Secondary | ICD-10-CM | POA: Diagnosis not present

## 2023-08-22 DIAGNOSIS — I10 Essential (primary) hypertension: Secondary | ICD-10-CM | POA: Diagnosis not present

## 2023-08-22 DIAGNOSIS — Z299 Encounter for prophylactic measures, unspecified: Secondary | ICD-10-CM | POA: Diagnosis not present

## 2023-09-10 DIAGNOSIS — Z299 Encounter for prophylactic measures, unspecified: Secondary | ICD-10-CM | POA: Diagnosis not present

## 2023-09-10 DIAGNOSIS — I10 Essential (primary) hypertension: Secondary | ICD-10-CM | POA: Diagnosis not present

## 2023-09-10 DIAGNOSIS — K589 Irritable bowel syndrome without diarrhea: Secondary | ICD-10-CM | POA: Diagnosis not present

## 2023-09-10 DIAGNOSIS — I700 Atherosclerosis of aorta: Secondary | ICD-10-CM | POA: Diagnosis not present

## 2023-09-25 DIAGNOSIS — E039 Hypothyroidism, unspecified: Secondary | ICD-10-CM | POA: Diagnosis not present

## 2023-09-25 DIAGNOSIS — F17210 Nicotine dependence, cigarettes, uncomplicated: Secondary | ICD-10-CM | POA: Diagnosis not present

## 2023-09-25 DIAGNOSIS — I10 Essential (primary) hypertension: Secondary | ICD-10-CM | POA: Diagnosis not present

## 2023-09-25 DIAGNOSIS — D692 Other nonthrombocytopenic purpura: Secondary | ICD-10-CM | POA: Diagnosis not present

## 2023-09-25 DIAGNOSIS — Z299 Encounter for prophylactic measures, unspecified: Secondary | ICD-10-CM | POA: Diagnosis not present

## 2023-12-25 DIAGNOSIS — Z299 Encounter for prophylactic measures, unspecified: Secondary | ICD-10-CM | POA: Diagnosis not present

## 2023-12-25 DIAGNOSIS — F17210 Nicotine dependence, cigarettes, uncomplicated: Secondary | ICD-10-CM | POA: Diagnosis not present

## 2023-12-25 DIAGNOSIS — I10 Essential (primary) hypertension: Secondary | ICD-10-CM | POA: Diagnosis not present

## 2023-12-25 DIAGNOSIS — I700 Atherosclerosis of aorta: Secondary | ICD-10-CM | POA: Diagnosis not present

## 2024-01-28 DIAGNOSIS — R1030 Lower abdominal pain, unspecified: Secondary | ICD-10-CM | POA: Diagnosis not present

## 2024-01-28 DIAGNOSIS — K589 Irritable bowel syndrome without diarrhea: Secondary | ICD-10-CM | POA: Diagnosis not present

## 2024-01-28 DIAGNOSIS — Z299 Encounter for prophylactic measures, unspecified: Secondary | ICD-10-CM | POA: Diagnosis not present

## 2024-01-28 DIAGNOSIS — R350 Frequency of micturition: Secondary | ICD-10-CM | POA: Diagnosis not present

## 2024-01-28 DIAGNOSIS — I10 Essential (primary) hypertension: Secondary | ICD-10-CM | POA: Diagnosis not present

## 2024-03-13 DIAGNOSIS — Z0000 Encounter for general adult medical examination without abnormal findings: Secondary | ICD-10-CM | POA: Diagnosis not present

## 2024-03-13 DIAGNOSIS — F17210 Nicotine dependence, cigarettes, uncomplicated: Secondary | ICD-10-CM | POA: Diagnosis not present

## 2024-03-13 DIAGNOSIS — Z299 Encounter for prophylactic measures, unspecified: Secondary | ICD-10-CM | POA: Diagnosis not present

## 2024-03-13 DIAGNOSIS — Z1389 Encounter for screening for other disorder: Secondary | ICD-10-CM | POA: Diagnosis not present

## 2024-03-13 DIAGNOSIS — I10 Essential (primary) hypertension: Secondary | ICD-10-CM | POA: Diagnosis not present

## 2024-03-13 DIAGNOSIS — K589 Irritable bowel syndrome without diarrhea: Secondary | ICD-10-CM | POA: Diagnosis not present

## 2024-03-13 DIAGNOSIS — R5383 Other fatigue: Secondary | ICD-10-CM | POA: Diagnosis not present

## 2024-03-13 DIAGNOSIS — Z7189 Other specified counseling: Secondary | ICD-10-CM | POA: Diagnosis not present

## 2024-04-09 DIAGNOSIS — F17210 Nicotine dependence, cigarettes, uncomplicated: Secondary | ICD-10-CM | POA: Diagnosis not present

## 2024-04-09 DIAGNOSIS — Z299 Encounter for prophylactic measures, unspecified: Secondary | ICD-10-CM | POA: Diagnosis not present

## 2024-04-09 DIAGNOSIS — I10 Essential (primary) hypertension: Secondary | ICD-10-CM | POA: Diagnosis not present

## 2024-04-09 DIAGNOSIS — K589 Irritable bowel syndrome without diarrhea: Secondary | ICD-10-CM | POA: Diagnosis not present

## 2024-04-17 ENCOUNTER — Encounter (INDEPENDENT_AMBULATORY_CARE_PROVIDER_SITE_OTHER): Payer: Self-pay | Admitting: *Deleted

## 2024-04-22 ENCOUNTER — Encounter (INDEPENDENT_AMBULATORY_CARE_PROVIDER_SITE_OTHER): Payer: Self-pay | Admitting: Gastroenterology

## 2024-04-22 ENCOUNTER — Ambulatory Visit (INDEPENDENT_AMBULATORY_CARE_PROVIDER_SITE_OTHER): Admitting: Gastroenterology

## 2024-04-22 VITALS — BP 170/76 | HR 65 | Temp 97.5°F | Ht 65.0 in | Wt 131.8 lb

## 2024-04-22 DIAGNOSIS — I708 Atherosclerosis of other arteries: Secondary | ICD-10-CM | POA: Insufficient documentation

## 2024-04-22 DIAGNOSIS — I10 Essential (primary) hypertension: Secondary | ICD-10-CM

## 2024-04-22 DIAGNOSIS — F17210 Nicotine dependence, cigarettes, uncomplicated: Secondary | ICD-10-CM

## 2024-04-22 DIAGNOSIS — R1084 Generalized abdominal pain: Secondary | ICD-10-CM | POA: Insufficient documentation

## 2024-04-22 DIAGNOSIS — R197 Diarrhea, unspecified: Secondary | ICD-10-CM | POA: Diagnosis not present

## 2024-04-22 DIAGNOSIS — R109 Unspecified abdominal pain: Secondary | ICD-10-CM

## 2024-04-22 NOTE — Patient Instructions (Signed)
 It was very nice to meet you today, as dicussed with will plan for the following :  1) CT angio abdomen

## 2024-04-22 NOTE — Progress Notes (Signed)
 Erin Watkins , M.D. Gastroenterology & Hepatology [facility] Gastroenterology [address] [place], [place] [address] Primary Care Physician: Rosamond Leta NOVAK, MD [address] [place] [address]  Chief Complaint: Abdominal pain, diarrhea, history of incomplete colonoscopy  History of Present Illness: Erin Watkins is a 78 y.o. female with history of hypertension, hyperlipidemia who presents for evaluation of Abdominal pain, diarrhea, history of incomplete colonoscopy  Patient reports that she was having diarrhea 3-4 times daily for 3 months until couple weeks ago when her primary care physician started her on cholestyramine and Bentyl  as per the patient.  Patient reports her diarrhea has significantly improved since that  Patient still complains of postprandial abdominal pain located generally The patient denies having any nausea, vomiting, fever, chills, hematochezia, melena, hematemesis, a jaundice, pruritus or weight loss.  Last labs from July 2024 hemoglobin 14.8 platelets 324 LDL 107 TSH 13  Last ZHI:wnwz Last Colonoscopy:2019  The colonoscopy was technically difficult and complex due to restricted mobility of the colon.  - Incomplete exam to ascending colon. - Diverticulosis in the sigmoid colon. - The distal rectum and anal verge are normal on retroflexion view.  FHx: neg for any gastrointestinal/liver disease, no malignancies Surgical: Laparoscopic cholecystectomy, open reduction of internal hernia and lysis of adhesions on 05/18/2022   Past Medical History: Past Medical History:  Diagnosis Date   Anxiety    GERD (gastroesophageal reflux disease)    Hypercholesteremia    Hypertension    Hypothyroidism    Thyroid  disease     Past Surgical History: Past Surgical History:  Procedure Laterality Date   breast biospy Left    CHOLECYSTECTOMY N/A 05/18/2022   Procedure: LAPAROSCOPIC CHOLECYSTECTOMY;  Surgeon: Kallie Manuelita BROCKS, MD;   Location: [facility];  Service: General;  Laterality: N/A;   COLONOSCOPY N/A 10/12/2017   Procedure: COLONOSCOPY;  Surgeon: Golda Claudis PENNER, MD;  Location: [facility];  Service: Endoscopy;  Laterality: N/A;  10:30   LAPAROSCOPY N/A 05/18/2022   Procedure: LAPAROSCOPY DIAGNOSTIC, OPEN REDUCTION OF INTERNAL HERNIA;  Surgeon: Kallie Manuelita BROCKS, MD;  Location: [facility];  Service: General;  Laterality: N/A;    Family History: Family History  Problem Relation Age of Onset   Diabetes Grandson    Colon cancer Neg Hx     Social History: Social History   Tobacco Use  Smoking Status Every Day   Current packs/day: 0.50   Average packs/day: 0.5 packs/day for 30.0 years (15.0 ttl pk-yrs)   Types: Cigarettes   Passive exposure: Never  Smokeless Tobacco Never   Social History   Substance and Sexual Activity  Alcohol Use No   Social History   Substance and Sexual Activity  Drug Use No    Allergies: No Known Allergies  Medications: Current Outpatient Medications  Medication Sig Dispense Refill   ALPRAZolam (XANAX) 0.5 MG tablet Take 0.25-0.5 mg by mouth 2 (two) times daily as needed.     amLODipine  (NORVASC ) 5 MG tablet Take 5 mg by mouth daily.     aspirin 325 MG tablet Take 325 mg by mouth every 6 (six) hours as needed for moderate pain.     Atorvastatin Calcium  (LIPITOR PO) Take by mouth as needed.     benazepril  (LOTENSIN ) 40 MG tablet Take 0.5 tablets (20 mg total) by mouth daily. (Needs to be seen before next refill) 15 tablet 0   levothyroxine  (SYNTHROID ) 100 MCG tablet Take 100 mcg by mouth daily before breakfast.  Melatonin 5 MG CHEW Chew 10 mg by mouth at bedtime.     omeprazole  (PRILOSEC  OTC) 20 MG tablet Take 20 mg by mouth daily.     dicyclomine  (BENTYL ) 10 MG capsule Take 1 capsule (10 mg total) by mouth 4 (four) times daily -  before meals and at bedtime. 120 capsule 1   No current facility-administered medications for this visit.    Review of Systems: GENERAL:  negative for malaise, night sweats HEENT: No changes in hearing or vision, no nose bleeds or other nasal problems. NECK: Negative for lumps, goiter, pain and significant neck swelling RESPIRATORY: Negative for cough, wheezing CARDIOVASCULAR: Negative for chest pain, leg swelling, palpitations, orthopnea GI: SEE HPI MUSCULOSKELETAL: Negative for joint pain or swelling, back pain, and muscle pain. SKIN: Negative for lesions, rash HEMATOLOGY Negative for prolonged bleeding, bruising easily, and swollen nodes. ENDOCRINE: Negative for cold or heat intolerance, polyuria, polydipsia and goiter. NEURO: negative for tremor, gait imbalance, syncope and seizures. The remainder of the review of systems is noncontributory.   Physical Exam: BP (!) 170/76 (BP Location: Left Arm, Patient Position: Sitting, Cuff Size: Normal)   Pulse 65   Temp (!) 97.5 F (36.4 C) (Temporal)   Ht 5' 5 (1.651 m)   Wt 131 lb 12.8 oz (59.8 kg)   BMI 21.93 kg/m  GENERAL: The patient is AO x3, in no acute distress. HEENT: Head is normocephalic and atraumatic. EOMI are intact. Mouth is well hydrated and without lesions. NECK: Supple. No masses LUNGS: Clear to auscultation. No presence of rhonchi/wheezing/rales. Adequate chest expansion HEART: RRR, normal s1 and s2. ABDOMEN: Soft, nontender, no guarding, no peritoneal signs, and nondistended. BS +. No masses.  Imaging/Labs: as above     Latest Ref Rng & Units 05/19/2022    3:30 AM 03/25/2019    9:38 AM 01/31/2015    6:47 PM  CBC  WBC 4.0 - 10.5 K/uL 22.1  7.6  7.9   Hemoglobin 12.0 - 15.0 g/dL 86.5  86.1  86.1   Hematocrit 36.0 - 46.0 % 40.6  42.3  41.1   Platelets 150 - 400 K/uL 251  275  241    No results found for: IRON, TIBC, FERRITIN  I personally reviewed and interpreted the available labs, imaging and endoscopic files.   CTA chest   IMPRESSION: 1. No evidence of aneurysmal disease of the thoracic aorta. 2. 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 Some of this plaque is likely ulcerated. No penetrating ulcer disease identified. 3. 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 4. Cholelithiasis.  CT Abdomen 2016  IMPRESSION: 1. No acute abnormality seen within the abdomen or pelvis. 2. Relatively diffuse calcification along the abdominal aorta and its branches. 3. Scattered diverticulosis along the sigmoid colon, without evidence of diverticulitis.  Impression and Plan: Erin Watkins is a 78 y.o. female with history of hypertension, hyperlipidemia who presents for evaluation of Abdominal pain, diarrhea, history of incomplete colonoscopy  #Abdominal pain  #Diarrhea #history of incomplete colonoscopy  Patient is known to have history of IBS-D.  Recently was started on cholestyramine and Bentyl  which has seemed to help with patient's symptoms  2019 patient had an incomplete colonoscopy to ascending colon due to restricted mobility  Previous CT demonstrating diffuse calcification and atherosclerosis in the  abdominal aorta.  Postprandial abdominal pain could be chronic mesenteric ischemia given above findings  I discussed with patient the utility of obtaining a CT angio abdomen to assess for any high-grade vascular occlusion and if encounter may refer to vascular surgery/IR  Can continue Bentyl  and cholestyramine given history of IBS-D  I discussed with patient that she never had a complete colonoscopy in ascending colon was not examined.  I offered her colonoscopy to examine remaining part of her colon. she is not interested in any further colonoscopies or virtual colonoscopy at this time.  She does verbalizes understanding this means missing a lesion such as polyp or  malignancy  # HTN  The patient was found to have elevated blood pressure when vital signs were checked in the office. The blood pressure was rechecked by the nursing staff and it was found be persistently elevated >140/90 mmHg. I personally advised to the patient to follow up closely with PCP for hypertension control.   All questions were answered.      Jarome Trull Faizan Zaylon Bossier, MD Gastroenterology and Hepatology [facility] Gastroenterology   This chart has been completed using [employer] Dictation software, and while attempts have been made to ensure accuracy , certain words and phrases may not be transcribed as intended

## 2024-04-23 ENCOUNTER — Telehealth: Payer: Self-pay | Admitting: *Deleted

## 2024-04-23 ENCOUNTER — Encounter: Payer: Self-pay | Admitting: *Deleted

## 2024-04-23 NOTE — Telephone Encounter (Signed)
 Attempted to call pt, unable to leave message due to mailbox not being set up.   CT at [facility] on Wednesday 05/07/24, arrive at 4:15 pm to check in.

## 2024-04-23 NOTE — Telephone Encounter (Signed)
 [employer] PA: CPT Code 25825 Description: CT ANGIO ABD&PELV W/O&W/DYE Case Number: [id number] Review Date: 04/23/2024 8:07:31 AM Expiration Date: N/A Status: This member's benefit plan did not require a prior authorization for this request.

## 2024-04-24 NOTE — Telephone Encounter (Signed)
 Pt informed of CT appointment date and time and location.

## 2024-05-07 ENCOUNTER — Ambulatory Visit (HOSPITAL_COMMUNITY)
Admission: RE | Admit: 2024-05-07 | Discharge: 2024-05-07 | Disposition: A | Discharge status: Home / Self Care | Source: Ambulatory Visit | Attending: Gastroenterology | Admitting: Gastroenterology

## 2024-05-07 DIAGNOSIS — I708 Atherosclerosis of other arteries: Secondary | ICD-10-CM | POA: Insufficient documentation

## 2024-05-07 DIAGNOSIS — I10 Essential (primary) hypertension: Secondary | ICD-10-CM | POA: Insufficient documentation

## 2024-05-07 DIAGNOSIS — K5730 Diverticulosis of large intestine without perforation or abscess without bleeding: Secondary | ICD-10-CM | POA: Diagnosis not present

## 2024-05-07 DIAGNOSIS — R1084 Generalized abdominal pain: Secondary | ICD-10-CM | POA: Diagnosis not present

## 2024-05-07 DIAGNOSIS — K429 Umbilical hernia without obstruction or gangrene: Secondary | ICD-10-CM | POA: Diagnosis not present

## 2024-05-07 DIAGNOSIS — N281 Cyst of kidney, acquired: Secondary | ICD-10-CM | POA: Diagnosis not present

## 2024-05-07 DIAGNOSIS — I701 Atherosclerosis of renal artery: Secondary | ICD-10-CM | POA: Diagnosis not present

## 2024-05-07 MED ORDER — IOHEXOL 350 MG/ML SOLN
100.0000 mL | Freq: Once | INTRAVENOUS | Status: AC | PRN
Start: 2024-05-07 — End: 2024-05-07
  Administered 2024-05-07: 100 mL via INTRAVENOUS

## 2024-05-14 ENCOUNTER — Ambulatory Visit (INDEPENDENT_AMBULATORY_CARE_PROVIDER_SITE_OTHER): Payer: Self-pay | Admitting: Gastroenterology

## 2024-05-14 NOTE — Progress Notes (Signed)
  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 I recommend you follow up with PCP and if she like we can refer her to vascular surgeons to discuss options    Also can this CT report faxed to patient PCP   Thanks,  Jadon Ressler Faizan Deletha Jaffee, MD Gastroenterology and Hepatology [facility] Gastroenterology  ===========  IMPRESSION: 1. No significant mesenteric artery stenosis. 2. 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 3. Colonic diverticulosis. 4. Small bowel containing umbilical hernia without evidence of obstruction.

## 2024-05-16 ENCOUNTER — Encounter (INDEPENDENT_AMBULATORY_CARE_PROVIDER_SITE_OTHER): Payer: Self-pay

## 2024-05-20 NOTE — Progress Notes (Signed)
report faxed to PCP  

## 2024-06-18 DIAGNOSIS — Z299 Encounter for prophylactic measures, unspecified: Secondary | ICD-10-CM | POA: Diagnosis not present

## 2024-06-18 DIAGNOSIS — I701 Atherosclerosis of renal artery: Secondary | ICD-10-CM | POA: Diagnosis not present

## 2024-06-18 DIAGNOSIS — I10 Essential (primary) hypertension: Secondary | ICD-10-CM | POA: Diagnosis not present

## 2024-06-18 DIAGNOSIS — M5416 Radiculopathy, lumbar region: Secondary | ICD-10-CM | POA: Diagnosis not present
